# Patient Record
Sex: Female | Born: 1990 | Race: White | Hispanic: No | Marital: Single | Smoking: Never smoker
Health system: Southern US, Community
[De-identification: ages and names within clinical notes are randomized; demographics above are authoritative.]

## PROBLEM LIST (undated history)

## (undated) DIAGNOSIS — T7840XA Allergy, unspecified, initial encounter: Secondary | ICD-10-CM

## (undated) DIAGNOSIS — F419 Anxiety disorder, unspecified: Secondary | ICD-10-CM

## (undated) HISTORY — DX: Allergy, unspecified, initial encounter: T78.40XA

## (undated) HISTORY — PX: NO PAST SURGERIES: SHX2092

---
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
## 2012-05-17 ENCOUNTER — Other Ambulatory Visit: Payer: Self-pay | Admitting: Physician Assistant

## 2012-05-18 NOTE — Telephone Encounter (Signed)
No paper chart °

## 2012-09-20 ENCOUNTER — Emergency Department (HOSPITAL_COMMUNITY): Payer: 59

## 2012-09-20 ENCOUNTER — Encounter (HOSPITAL_COMMUNITY): Payer: Self-pay | Admitting: Physical Medicine and Rehabilitation

## 2012-09-20 ENCOUNTER — Emergency Department (HOSPITAL_COMMUNITY)
Admission: EM | Admit: 2012-09-20 | Discharge: 2012-09-20 | Disposition: A | Discharge status: Home / Self Care | Payer: 59 | Attending: Emergency Medicine | Admitting: Emergency Medicine

## 2012-09-20 DIAGNOSIS — IMO0002 Reserved for concepts with insufficient information to code with codable children: Secondary | ICD-10-CM | POA: Insufficient documentation

## 2012-09-20 DIAGNOSIS — Y92410 Unspecified street and highway as the place of occurrence of the external cause: Secondary | ICD-10-CM | POA: Insufficient documentation

## 2012-09-20 DIAGNOSIS — Z79899 Other long term (current) drug therapy: Secondary | ICD-10-CM | POA: Insufficient documentation

## 2012-09-20 DIAGNOSIS — S139XXA Sprain of joints and ligaments of unspecified parts of neck, initial encounter: Secondary | ICD-10-CM | POA: Insufficient documentation

## 2012-09-20 DIAGNOSIS — S161XXA Strain of muscle, fascia and tendon at neck level, initial encounter: Secondary | ICD-10-CM

## 2012-09-20 DIAGNOSIS — Y9389 Activity, other specified: Secondary | ICD-10-CM | POA: Insufficient documentation

## 2012-09-20 MED ORDER — CYCLOBENZAPRINE HCL 5 MG PO TABS: 5.0000 mg | ORAL_TABLET | Freq: Three times a day (TID) | ORAL | Status: DC | PRN

## 2012-09-20 MED ORDER — NAPROXEN 500 MG PO TABS: 500.0000 mg | ORAL_TABLET | Freq: Two times a day (BID) | ORAL | Status: DC

## 2012-09-20 MED ORDER — HYDROCODONE-ACETAMINOPHEN 5-325 MG PO TABS: 1.0000 | ORAL_TABLET | Freq: Four times a day (QID) | ORAL | Status: DC | PRN

## 2012-09-20 MED ORDER — HYDROCODONE-ACETAMINOPHEN 5-325 MG PO TABS
1.0000 | ORAL_TABLET | Freq: Once | ORAL | Status: AC
  Administered 2012-09-20: 1 via ORAL
  Filled 2012-09-20: qty 1
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
## 2013-06-12 ENCOUNTER — Other Ambulatory Visit: Payer: Self-pay | Admitting: Physician Assistant

## 2013-06-22 ENCOUNTER — Other Ambulatory Visit: Payer: Self-pay | Admitting: Physician Assistant

## 2013-06-23 ENCOUNTER — Other Ambulatory Visit: Payer: Self-pay | Admitting: Physician Assistant

## 2014-05-17 ENCOUNTER — Ambulatory Visit (INDEPENDENT_AMBULATORY_CARE_PROVIDER_SITE_OTHER): Payer: No Typology Code available for payment source | Admitting: Physician Assistant

## 2014-05-17 VITALS — BP 120/64 | HR 80 | Temp 99.2°F | Resp 16 | Ht 65.5 in | Wt 154.0 lb

## 2014-05-17 DIAGNOSIS — J0100 Acute maxillary sinusitis, unspecified: Secondary | ICD-10-CM

## 2014-05-17 MED ORDER — TRIAMCINOLONE ACETONIDE 55 MCG/ACT NA AERO: 2.0000 | INHALATION_SPRAY | Freq: Every day | NASAL | Status: DC

## 2014-05-17 MED ORDER — GUAIFENESIN ER 1200 MG PO TB12: 1.0000 | ORAL_TABLET | Freq: Two times a day (BID) | ORAL | Status: AC

## 2014-05-17 MED ORDER — AMOXICILLIN 875 MG PO TABS: 875.0000 mg | ORAL_TABLET | Freq: Two times a day (BID) | ORAL | Status: DC

## 2014-05-19 NOTE — Progress Notes (Signed)
Subjective:    Patient ID: Sheryl Booker, female    DOB: 05-15-1991, 23 y.o.   MRN: [id number]  HPI  Pt presents to clinic with continued cold symptoms and feeling poorly.  She is a [employer] and [facility].  She has ben really tired all semester.  Over the last month she has had congestion and this sore throat that seems to get a little better but never really resolves.  She has been taking OTC meds intermittently throughout the illness but never gets resolution.  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  She feels like she is being pushed harder than what she can handle but the coach will not let up on his pressure for her to swim longer and harder.  She really wants to get better.  Review of Systems  Constitutional: Negative for fever and chills.  HENT: Positive for congestion, postnasal drip, rhinorrhea (clear) and sore throat.   Respiratory: Negative for cough, shortness of breath and wheezing.   Neurological: Positive for headaches (intermittent at times).       Objective:   Physical Exam  Constitutional: She is oriented to person, place, and time. She appears well-developed and well-nourished.  BP 120/64 mmHg  Pulse 80  Temp(Src) 99.2 F (37.3 C)  Resp 16  Ht 5' 5.5" (1.664 m)  Wt 154 lb (69.854 kg)  BMI 25.23 kg/m2  SpO2 97%  LMP 04/17/2014   HENT:  Head: Normocephalic and atraumatic.  Right Ear: Hearing, tympanic membrane, external ear and ear canal normal.  Left Ear: Hearing, tympanic membrane, external ear and ear canal normal.  Nose: Mucosal edema (pale) present. Right sinus exhibits no maxillary sinus tenderness and no frontal sinus tenderness. Left sinus exhibits no maxillary sinus tenderness and no frontal sinus tenderness.  Mouth/Throat: Uvula is midline, oropharynx is clear and moist and mucous membranes are normal.  Neck:  Normal range of motion.  Cardiovascular: Normal rate, regular rhythm and normal heart sounds.   No murmur heard. Pulmonary/Chest: Effort normal and breath sounds normal. She has no wheezes.  Abdominal: Soft. There is no hepatosplenomegaly. There is no tenderness.  Lymphadenopathy:       Head (right side): No tonsillar and no occipital adenopathy present.       Head (left side): No tonsillar and no occipital adenopathy present.    She has no cervical adenopathy.       Right: No supraclavicular adenopathy present.       Left: No supraclavicular adenopathy present.  Neurological: She is alert and oriented to person, place, and time.  Skin: Skin is warm and dry.  Psychiatric: She has a normal mood and affect. Her behavior is normal. Judgment and thought content normal.   Pt had a mono titer at [employer] - It showed IgG positive and IgM negative for mono.        Assessment & Plan:  Subacute maxillary sinusitis - Plan: amoxicillin (AMOXIL) 875 MG tablet, Guaifenesin (MUCINEX MAXIMUM STRENGTH) 1200 MG TB12, triamcinolone (NASACORT AQ) 55 MCG/ACT AERO nasal inhaler   I am going to treat her for a sinus infection due to the timing of her symptoms. We are also going to treat her symptoms and give her a letter for swimming to allow her to rest during the semester break.  I will try and get her results from  [employer] after the holidays.  Benny LennertSarah Khaiden Segreto PA-C  [facility] 05/19/2014 8:54 AM

## 2014-06-22 ENCOUNTER — Other Ambulatory Visit: Payer: Self-pay | Admitting: Physician Assistant

## 2014-07-17 ENCOUNTER — Other Ambulatory Visit: Payer: Self-pay | Admitting: Physician Assistant

## 2015-07-16 ENCOUNTER — Encounter (HOSPITAL_COMMUNITY): Payer: Self-pay | Admitting: Family Medicine

## 2015-07-16 ENCOUNTER — Emergency Department (HOSPITAL_COMMUNITY)
Admission: EM | Admit: 2015-07-16 | Discharge: 2015-07-17 | Disposition: A | Discharge status: Other Acute Inpatient Hospital | Payer: BLUE CROSS/BLUE SHIELD | Attending: Emergency Medicine | Admitting: Emergency Medicine

## 2015-07-16 DIAGNOSIS — Z3202 Encounter for pregnancy test, result negative: Secondary | ICD-10-CM | POA: Insufficient documentation

## 2015-07-16 DIAGNOSIS — F4321 Adjustment disorder with depressed mood: Secondary | ICD-10-CM

## 2015-07-16 DIAGNOSIS — Z79899 Other long term (current) drug therapy: Secondary | ICD-10-CM | POA: Insufficient documentation

## 2015-07-16 DIAGNOSIS — R45851 Suicidal ideations: Secondary | ICD-10-CM

## 2015-07-16 LAB — COMPREHENSIVE METABOLIC PANEL
ALBUMIN: 5.1 g/dL — AB (ref 3.5–5.0)
ALT: 14 U/L (ref 14–54)
AST: 17 U/L (ref 15–41)
Alkaline Phosphatase: 47 U/L (ref 38–126)
Anion gap: 12 (ref 5–15)
BUN: 11 mg/dL (ref 6–20)
CO2: 23 mmol/L (ref 22–32)
Calcium: 9.9 mg/dL (ref 8.9–10.3)
Chloride: 105 mmol/L (ref 101–111)
Creatinine, Ser: 0.81 mg/dL (ref 0.44–1.00)
GFR calc Af Amer: >60 mL/min (ref >60)
GLUCOSE: 92 mg/dL (ref 65–99)
Potassium: 3.7 mmol/L (ref 3.5–5.1)
SODIUM: 140 mmol/L (ref 135–145)
TOTAL PROTEIN: 7.9 g/dL (ref 6.5–8.1)
Total Bilirubin: 0.6 mg/dL (ref 0.3–1.2)

## 2015-07-16 LAB — ACETAMINOPHEN LEVEL

## 2015-07-16 LAB — CBC
HCT: 42.5 % (ref 36.0–46.0)
Hemoglobin: 14.3 g/dL (ref 12.0–15.0)
MCH: 28.9 pg (ref 26.0–34.0)
MCHC: 33.6 g/dL (ref 30.0–36.0)
MCV: 86 fL (ref 78.0–100.0)
PLATELETS: 249 10*3/uL (ref 150–400)
RBC: 4.94 MIL/uL (ref 3.87–5.11)
RDW: 12.9 % (ref 11.5–15.5)
WBC: 5.4 10*3/uL (ref 4.0–10.5)

## 2015-07-16 LAB — RAPID URINE DRUG SCREEN, HOSP PERFORMED
AMPHETAMINES: NOT DETECTED
BENZODIAZEPINES: NOT DETECTED
Barbiturates: NOT DETECTED
COCAINE: NOT DETECTED
OPIATES: NOT DETECTED
Tetrahydrocannabinol: NOT DETECTED

## 2015-07-16 LAB — ETHANOL

## 2015-07-16 LAB — SALICYLATE LEVEL

## 2015-07-16 LAB — POC URINE PREG, ED: PREG TEST UR: NEGATIVE

## 2015-07-16 MED ORDER — NORGESTIMATE-ETH ESTRADIOL 0.25-35 MG-MCG PO TABS: 1.0000 | ORAL_TABLET | Freq: Every day | ORAL | Status: DC

## 2015-07-16 MED ORDER — ZOLPIDEM TARTRATE 5 MG PO TABS: 5.0000 mg | ORAL_TABLET | Freq: Every evening | ORAL | Status: DC | PRN

## 2015-07-16 MED ORDER — ONDANSETRON HCL 4 MG PO TABS: 4.0000 mg | ORAL_TABLET | Freq: Three times a day (TID) | ORAL | Status: DC | PRN

## 2015-07-16 MED ORDER — IBUPROFEN 200 MG PO TABS: 600.0000 mg | ORAL_TABLET | Freq: Three times a day (TID) | ORAL | Status: DC | PRN

## 2015-07-16 MED ORDER — LORAZEPAM 1 MG PO TABS: 1.0000 mg | ORAL_TABLET | Freq: Three times a day (TID) | ORAL | Status: DC | PRN

## 2015-07-16 MED ORDER — ACETAMINOPHEN 325 MG PO TABS: 650.0000 mg | ORAL_TABLET | ORAL | Status: DC | PRN

## 2015-07-16 MED ORDER — ALUM & MAG HYDROXIDE-SIMETH 200-200-20 MG/5ML PO SUSP: 30.0000 mL | ORAL | Status: DC | PRN

## 2015-07-16 NOTE — BH Assessment (Signed)
Assessment completed. Consulted Hulan Fess, NP who recommended overnight observation. Informed Mercedes Camprubi-Soms, PA-C of the recommendation.
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
## 2015-07-16 NOTE — ED Notes (Signed)
Pt being eval by TTS [facility] at present. 
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
## 2015-07-16 NOTE — ED Provider Notes (Signed)
CSN: [id number]     Arrival date & time 07/16/15  1931 History   First MD Initiated Contact with Patient 07/16/15 2104     Chief Complaint  Patient presents with  . Suicidal     (Consider location/radiation/quality/duration/timing/severity/associated sxs/prior Treatment) HPI Comments: Sheryl Booker is a 25 y.o. female who presents to the ED accompanied by law enforcement who to brought her in after her ex-boyfriend called the police due to pt being suicidal. Pt states that she's had suicidal ideations x3 wks due to breaking up with her boyfriend, has no prior hx of depression. States her plan was to "crash her car". She came in voluntarily when the cops gave her the option of coming voluntarily or involuntarily, does not seem that IVC paperwork was taken out. She denies HI/AVH. Denies illicit drug use and EtOH use. Nonsmoker. No medical problems, only medicines are birth control. Currently on her menses. She has no medical complaints today. Her mother accompanied her today. Pt states she "wants help" for her suicidal thoughts. Does not want to go into what happened at her ex-boyfriends house, simply states that she went over there to see their dog and see him, and that then the cops were called.  Patient is a 25 y.o. female presenting with mental health disorder. The history is provided by the patient. No language interpreter was used.  Mental Health Problem Presenting symptoms: suicidal thoughts and suicidal threats   Presenting symptoms: no hallucinations and no homicidal ideas   Patient accompanied by:  Family member and law enforcement Onset quality:  Gradual Duration:  3 weeks Timing:  Constant Progression:  Unchanged Chronicity:  New Context: stressful life event   Treatment compliance:  Untreated Relieved by:  None tried Worsened by:  Nothing tried Ineffective treatments:  None tried Associated symptoms: no abdominal pain and no chest pain   Risk factors: no hx of mental illness      History reviewed. No pertinent past medical history. History reviewed. No pertinent past surgical history. History reviewed. No pertinent family history. Social History  Substance Use Topics  . Smoking status: Never Smoker   . Smokeless tobacco: None  . Alcohol Use: No   OB History    No data available     Review of Systems  Constitutional: Negative for fever and chills.  Respiratory: Negative for shortness of breath.   Cardiovascular: Negative for chest pain.  Gastrointestinal: Negative for nausea, vomiting, abdominal pain, diarrhea and constipation.  Genitourinary: Negative for dysuria, hematuria and vaginal discharge. Vaginal bleeding: on menses.  Musculoskeletal: Negative for myalgias and arthralgias.  Skin: Negative for color change.  Allergic/Immunologic: Negative for immunocompromised state.  Neurological: Negative for weakness and numbness.  Psychiatric/Behavioral: Positive for suicidal ideas. Negative for homicidal ideas, hallucinations and confusion.   10 Systems reviewed and are negative for acute change except as noted in the HPI.    Allergies  Sulfa antibiotics  Home Medications   Prior to Admission medications   Medication Sig Start Date End Date Taking? Authorizing Provider  ibuprofen (ADVIL,MOTRIN) 200 MG tablet Take 400 mg by mouth every 6 (six) hours as needed for headache or moderate pain.   Yes Historical Provider, MD  Multiple Vitamins-Minerals (HAIR/SKIN/NAILS) TABS Take 1 tablet by mouth 2 (two) times daily.   Yes Historical Provider, MD  ORTHO-CYCLEN, 28, 0.25-35 MG-MCG tablet Take 1 tablet by mouth daily. 06/21/15  Yes Historical Provider, MD   BP 139/91 mmHg  Pulse 97  Temp(Src) 98.9 F (37.2 C) (Oral)  Resp 14  Ht 5\' 6"  (1.676 m)  Wt 77.111 kg  BMI 27.45 kg/m2  SpO2 100%  LMP 07/16/2015 Physical Exam  Constitutional: She is oriented to person, place, and time. Vital signs are normal. She appears well-developed and well-nourished.   Non-toxic appearance. No distress.  Afebrile, nontoxic, NAD  HENT:  Head: Normocephalic and atraumatic.  Mouth/Throat: Oropharynx is clear and moist and mucous membranes are normal.  Eyes: Conjunctivae and EOM are normal. Right eye exhibits no discharge. Left eye exhibits no discharge.  Neck: Normal range of motion. Neck supple.  Cardiovascular: Normal rate, regular rhythm, normal heart sounds and intact distal pulses.  Exam reveals no gallop and no friction rub.   No murmur heard. Pulmonary/Chest: Effort normal and breath sounds normal. No respiratory distress. She has no decreased breath sounds. She has no wheezes. She has no rhonchi. She has no rales.  Abdominal: Soft. Normal appearance and bowel sounds are normal. She exhibits no distension. There is no tenderness. There is no rigidity, no rebound, no guarding, no CVA tenderness, no tenderness at McBurney's point and negative Murphy's sign.  Musculoskeletal: Normal range of motion.  Neurological: She is alert and oriented to person, place, and time. She has normal strength. No sensory deficit.  Skin: Skin is warm, dry and intact. No rash noted.  Psychiatric: She is not actively hallucinating. She exhibits a depressed mood. She expresses suicidal ideation. She expresses no homicidal ideation. She expresses suicidal plans. She expresses no homicidal plans.  Depressed affect, not wanting to talk much, endorsing SI with a plan to crash her car. No HI/AVH endorsed.  Nursing note and vitals reviewed.   ED Course  Procedures (including critical care time) Labs Review Labs Reviewed  COMPREHENSIVE METABOLIC PANEL - Abnormal; Notable for the following:    Albumin 5.1 (*)    All other components within normal limits  ACETAMINOPHEN LEVEL - Abnormal; Notable for the following:    Acetaminophen (Tylenol), Serum <10 (*)    All other components within normal limits  ETHANOL  SALICYLATE LEVEL  CBC  URINE RAPID DRUG SCREEN, HOSP PERFORMED  POC  URINE PREG, ED    Imaging Review No results found. I have personally reviewed and evaluated these images and lab results as part of my medical decision-making.   EKG Interpretation None      MDM   Final diagnoses:  Suicidal ideations  Suicide threat or attempt  Adjustment disorder with depressed mood    25 y.o. female here with SI x3 wks, plan to crash her car. Boyfriend call the cops on her today and she came voluntarily. Does not seem that IVC paperwork was taken out, but if she attempted to leave we would need to get this paperwork started. No HI/AVH. No EtOH or drug use. Nonsmoker. Otherwise healthy. Will await clearance labs and consult TTS. Holding orders and home meds ordered. Will reassess after labs return.   9:54 PM Upreg neg. UDS pending but this should not hold up her med clearance. All other labs unremarkable. Pt medically cleared. TTS consulted, please see their notes for further documentation of care and dispo status. Pt here voluntarily, if she tries to leave will need to get IVC paperwork taken out. Pt stable at this time. Holding orders placed. Home OCPs ordered but pharmacy states they don't carry it, pt will need to use her own supply.  BP 139/91 mmHg  Pulse 97  Temp(Src) 98.9 F (37.2 C) (Oral)  Resp 14  Ht 5\' 6"  (  1.676 m)  Wt 77.111 kg  BMI 27.45 kg/m2  SpO2 100%  LMP 07/16/2015  Meds ordered this encounter  Medications  . ondansetron (ZOFRAN) tablet 4 mg    Sig:   . alum & mag hydroxide-simeth (MAALOX/MYLANTA) 200-200-20 MG/5ML suspension 30 mL    Sig:   . zolpidem (AMBIEN) tablet 5 mg    Sig:   . ibuprofen (ADVIL,MOTRIN) tablet 600 mg    Sig:   . acetaminophen (TYLENOL) tablet 650 mg    Sig:   . LORazepam (ATIVAN) tablet 1 mg    Sig:   . DISCONTD: norgestimate-ethinyl estradiol (ORTHO-CYCLEN,SPRINTEC,PREVIFEM) 0.25-35 MG-MCG tablet 1 tablet    Sig:        Saraia Platner Camprubi-Soms, PA-C 07/16/15 2156  Raeford Razor, MD 07/20/15 [phone]

## 2015-07-17 ENCOUNTER — Observation Stay (HOSPITAL_COMMUNITY)
Admission: AD | Admit: 2015-07-17 | Discharge: 2015-07-17 | Disposition: A | Discharge status: Home / Self Care | Payer: BLUE CROSS/BLUE SHIELD | Source: Intra-hospital | Attending: Psychiatry | Admitting: Psychiatry

## 2015-07-17 DIAGNOSIS — R45851 Suicidal ideations: Secondary | ICD-10-CM | POA: Diagnosis not present

## 2015-07-17 DIAGNOSIS — F4321 Adjustment disorder with depressed mood: Secondary | ICD-10-CM | POA: Insufficient documentation

## 2015-07-17 NOTE — ED Notes (Signed)
Report called to RN Lowella Bandy, OBS Unit.  Pending [facility] transport.

## 2015-07-17 NOTE — ED Notes (Signed)
Called [facility] transport.

## 2015-07-17 NOTE — Progress Notes (Signed)
Sheryl Booker came in from [facility] accompanied by her mom. Sheryl Booker expressing not being happy moving her one place to another. This Clinical research associate explained to Sheryl Booker and mother the plan of care for her being admitted in Obs. Unit. Sheryl Booker stated "why is no one telling me the truth about my stay here in the hospital. I want to go home". 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  Writer notified Christen Bame, NP of Sheryl Booker's decision. Christen Bame, NP spoke to Sheryl Booker and mom. Mom agreed to stay with Sheryl Booker tonight for safety reasons. Sheryl Booker was discharged home.

## 2015-07-18 ENCOUNTER — Ambulatory Visit (INDEPENDENT_AMBULATORY_CARE_PROVIDER_SITE_OTHER): Payer: BLUE CROSS/BLUE SHIELD | Admitting: Physician Assistant

## 2015-07-18 ENCOUNTER — Telehealth: Payer: Self-pay

## 2015-07-18 ENCOUNTER — Telehealth: Payer: Self-pay | Admitting: Physician Assistant

## 2015-07-18 VITALS — BP 130/80 | HR 79 | Temp 98.6°F | Resp 16 | Ht 66.0 in | Wt 165.8 lb

## 2015-07-18 DIAGNOSIS — N946 Dysmenorrhea, unspecified: Secondary | ICD-10-CM | POA: Diagnosis not present

## 2015-07-18 MED ORDER — NORGESTIMATE-ETH ESTRADIOL 0.25-35 MG-MCG PO TABS: 1.0000 | ORAL_TABLET | Freq: Every day | ORAL | Status: DC

## 2015-07-18 MED ORDER — LEVONORGESTREL-ETHINYL ESTRAD 0.1-20 MG-MCG PO TABS: 1.0000 | ORAL_TABLET | Freq: Every day | ORAL | Status: DC

## 2015-07-18 NOTE — Progress Notes (Signed)
   Sheryl Booker  MRN: [id number] DOB: 30-Jan-1991  Subjective:  Pt presents to clinic because she needs her birth control refilled.  When she called the pharmacy she was told her insurance would not longer cover the type of birth control she was on and she would like to switch.  She is doing well on it and her cramps are much better - she is currently not sexually active.  She has not had a pap smear but she does not have rime today and she really wants to set up her care at her moms gyn.  There are no active problems to display for this patient.   Current Outpatient Prescriptions on File Prior to Visit  Medication Sig Dispense Refill  . ibuprofen (ADVIL,MOTRIN) 200 MG tablet Take 400 mg by mouth every 6 (six) hours as needed for headache or moderate pain.    . Multiple Vitamins-Minerals (HAIR/SKIN/NAILS) TABS Take 1 tablet by mouth 2 (two) times daily.     No current facility-administered medications on file prior to visit.    Allergies  Allergen Reactions  . Sulfa Antibiotics Anaphylaxis    Review of Systems Objective:  BP 130/80 mmHg  Pulse 79  Temp(Src) 98.6 F (37 C) (Oral)  Resp 16  Ht  (1.676 m)  Wt 165 lb 12.8 oz (75.206 kg)  BMI 26.77 kg/m2  SpO2 99%  LMP 07/16/2015  Physical Exam  Constitutional: She is oriented to person, place, and time and well-developed, well-nourished, and in no distress.  HENT:  Head: Normocephalic and atraumatic.  Right Ear: Hearing and external ear normal.  Left Ear: Hearing and external ear normal.  Eyes: Conjunctivae are normal.  Neck: Normal range of motion.  Pulmonary/Chest: Effort normal.  Neurological: She is alert and oriented to person, place, and time. Gait normal.  Skin: Skin is warm and dry.  Psychiatric: Mood, memory, affect and judgment normal.  Vitals reviewed.   Assessment and Plan :  Dysmenorrhea - Plan: norgestimate-ethinyl estradiol (ORTHO-CYCLEN, 28,) 0.25-35 MG-MCG tablet, DISCONTINUED:  levonorgestrel-ethinyl estradiol (AVIANE) 0.1-20 MG-MCG tablet  Refilled medication - she will f/u with her new PCP for pap smear  Benny Lennert PA-C  [facility] 07/18/2015 3:12 PM

## 2015-07-18 NOTE — Telephone Encounter (Signed)
Called pharmacy and canceled her aviane rx by request of Maralyn Sago

## 2015-07-18 NOTE — Telephone Encounter (Signed)
Open in error

## 2016-06-20 ENCOUNTER — Other Ambulatory Visit: Payer: Self-pay | Admitting: Physician Assistant

## 2016-06-20 DIAGNOSIS — N946 Dysmenorrhea, unspecified: Secondary | ICD-10-CM

## 2016-06-20 NOTE — Telephone Encounter (Signed)
Spoke with patient, sent in a 3 month refill for patient. She is due for f/u. She plans on calling the clinic for a f/u appointment soon.

## 2016-06-20 NOTE — Telephone Encounter (Signed)
Patient needs her Birth control refilled she states she was suppose to to start them yesterday but doesn't have them.  She would like to know how she is suppose to take them now since she missed her first dose.  Her call back number is [phone]

## 2016-06-21 NOTE — Telephone Encounter (Signed)
Please call patient and get her to schedule an appt with me within 2 months.

## 2016-09-13 ENCOUNTER — Ambulatory Visit (INDEPENDENT_AMBULATORY_CARE_PROVIDER_SITE_OTHER): Payer: BLUE CROSS/BLUE SHIELD | Admitting: Family Medicine

## 2016-09-13 VITALS — BP 124/83 | HR 80 | Temp 98.1°F | Resp 18 | Ht 66.0 in | Wt 169.2 lb

## 2016-09-13 DIAGNOSIS — Z124 Encounter for screening for malignant neoplasm of cervix: Secondary | ICD-10-CM | POA: Diagnosis not present

## 2016-09-13 DIAGNOSIS — Z0000 Encounter for general adult medical examination without abnormal findings: Secondary | ICD-10-CM | POA: Diagnosis not present

## 2016-09-13 DIAGNOSIS — N946 Dysmenorrhea, unspecified: Secondary | ICD-10-CM | POA: Diagnosis not present

## 2016-09-13 MED ORDER — NORGESTIMATE-ETH ESTRADIOL 0.25-35 MG-MCG PO TABS: 1.0000 | ORAL_TABLET | Freq: Every day | ORAL | 3 refills | Status: DC

## 2016-09-13 NOTE — Patient Instructions (Addendum)
   IF you received an x-ray today, you will receive an invoice from [facility]. Please contact [facility] at [phone] with questions or concerns regarding your invoice.   IF you received labwork today, you will receive an invoice from [facility]. Please contact [facility] at [phone] with questions or concerns regarding your invoice.   Our billing staff will not be able to assist you with questions regarding bills from these companies.  You will be contacted with the lab results as soon as they are available. The fastest way to get your results is to activate your My Chart account. Instructions are located on the last page of this paperwork. If you have not heard from us regarding the results in 2 weeks, please contact this office.     Preventing Cervical Cancer Cervical cancer is cancer that grows on the cervix. The cervix is at the bottom of the uterus. It connects the uterus to the vagina. The uterus is where a baby develops during pregnancy. Cancer occurs when cells become abnormal and start to grow out of control. Cervical cancer grows slowly and may not cause any symptoms at first. Over time, the cancer can grow deep into the cervix tissue and spread to other areas. If it is found early, cervical cancer can be treated effectively. You can also take steps to prevent this type of cancer. Most cases of cervical cancer are caused by an STI (sexually transmitted infection) called human papillomavirus (HPV). One way to reduce your risk of cervical cancer is to avoid infection with the HPV virus. You can do this by practicing safe sex and by getting the HPV vaccine. Getting regular Pap tests is also important because this can help identify changes in cells that could lead to cancer. Your chances of getting this disease can also be reduced by making certain lifestyle changes. How can I protect myself from cervical cancer? Preventing HPV infection  Ask your health care  provider about getting the HPV vaccine. If you are 26 years old or younger, you may need to get this vaccine, which is given in three doses over 6 months. This vaccine protects against the types of HPV that could cause cancer.  Limit the number of people you have sex with. Also avoid having sex with people who have had many sex partners.  Use a latex condom during sex. Getting Pap tests  Get Pap tests regularly, starting at age 21. Talk with your health care provider about how often you need these tests.  Most women who are 21?24 years of age should have a Pap test every 3 years.  Most women who are 30?24 years of age should have a Pap test in combination with an HPV test every 5 years.  Women with a higher risk of cervical cancer, such as those with a weakened immune system or those who have been exposed to the drug diethylstilbestrol (DES), may need more frequent testing. Making other lifestyle changes  Do not use any products that contain nicotine or tobacco, such as cigarettes and e-cigarettes. If you need help quitting, ask your health care provider.  Eat at least 5 servings of fruits and vegetables every day.  Lose weight if you are overweight. Why are these changes important?  These changes and screening tests are designed to address the factors that are known to increase the risk of cervical cancer. Taking these steps is the best way to reduce your risk.  Having regular Pap tests will help identify changes   in cells that could lead to cancer. Steps can then be taken to prevent cancer from developing.  These changes will also help find cervical cancer early. This type of cancer can be treated effectively if it is found early. It can be more dangerous and difficult to treat if cancer has grown deep into your cervix or has spread.  In addition to making you less likely to get cervical cancer, these changes will also provide other health benefits, such as the following:  Practicing  safe sex is important for preventing STIs and unplanned pregnancies.  Avoiding tobacco can reduce your risk for other cancers and health issues.  Eating a healthy diet and maintaining a healthy weight are good for your overall health. What can happen if changes are not made? In the early stages, cervical cancer might not have any symptoms. It can take many years for the cancer to grow and get deep into the cervix tissue. This may be happening without you knowing about it. If you develop any symptoms, such as pelvic pain or unusual discharge or bleeding from your vagina, you should see your health care provider right away. If cervical cancer is not found early, you might need treatments such as radiation, chemotherapy, or surgery. In some cases, surgery may mean that you will not be able to get pregnant or carry a pregnancy to term. Where to find support: Talk with your health care provider, school nurse, or local health department for guidance about screening and vaccination. Some children and teens may be able to get the HPV vaccine free of charge through the U.S. government's Vaccines for Children (VFC) program. Other places that provide vaccinations include:  Public health clinics. Check with your local health department.  Federally Qualified Health Centers, where you would pay only what you can afford. To find one near you, check this website: www.fqhc.org/find-an-fqhc/  Rural Health Clinics. These are part of a program for Medicare and Medicaid patients who live in rural areas. The National Breast and Cervical Cancer Early Detection Program also provides breast and cervical cancer screenings and diagnostic services to low-income, uninsured, and underinsured women. Cervical cancer can be passed down through families. Talk with your health care provider or genetic counselor to learn more about genetic testing for cancer. Where to find more information: Learn more about cervical cancer  from:  American College of Gynecology: www.acog.org/Patients/FAQs/Cervical-Cancer  American Cancer Society: www.cancer.org/cancer/cervicalcancer/  U.S. Centers for Disease Control and Prevention: www.cdc.gov/cancer/cervical/ Summary  Talk with your health care provider about getting the HPV vaccine.  Be sure to get regular Pap tests as recommended by your health care provider.  See your health care provider right away if you have any pelvic pain or unusual discharge or bleeding from your vagina. This information is not intended to replace advice given to you by your health care provider. Make sure you discuss any questions you have with your health care provider. Document Released: 05/28/2015 Document Revised: 01/09/2016 Document Reviewed: 01/09/2016 Elsevier Interactive Patient Education  2017 [employer]  

## 2016-09-13 NOTE — Progress Notes (Signed)
Chief Complaint  Patient presents with  . Annual Exam    needs it for birth control     Subjective:  Sheryl Booker is a 26 y.o. female here for a health maintenance visit.  Patient is established pt  Patient's last menstrual period was 09/06/2016. She is on OCPs She denies side effects  Non smoker No migraines No family history of clotting disorder  There are no active problems to display for this patient.   Past Medical History:  Diagnosis Date  . Allergy     No past surgical history on file.   Outpatient Medications Prior to Visit  Medication Sig Dispense Refill  . MONONESSA 0.25-35 MG-MCG tablet TAKE 1 TABLET BY MOUTH EVERY DAY 3 Package 0  . MONONESSA 0.25-35 MG-MCG tablet TAKE 1 TABLET BY MOUTH EVERY DAY 84 tablet 0  . ibuprofen (ADVIL,MOTRIN) 200 MG tablet Take 400 mg by mouth every 6 (six) hours as needed for headache or moderate pain.    . Multiple Vitamins-Minerals (HAIR/SKIN/NAILS) TABS Take 1 tablet by mouth 2 (two) times daily.     No facility-administered medications prior to visit.     Allergies  Allergen Reactions  . Sulfa Antibiotics Anaphylaxis     Family History  Problem Relation Age of Onset  . Diabetes Maternal Grandmother   . Stroke Maternal Grandmother   . Cancer Maternal Grandfather      Health Habits: Dental Exam: up to date Eye Exam: up to date Exercise: 3 times/week on average Current exercise activities: walking/running Diet:   Social History   Social History  . Marital status: Single    Spouse name: N/A  . Number of children: N/A  . Years of education: N/A   Occupational History  . Not on file.   Social History Main Topics  . Smoking status: Never Smoker  . Smokeless tobacco: Never Used  . Alcohol use No  . Drug use: No  . Sexual activity: Not on file   Other Topics Concern  . Not on file   Social History Narrative   [place] college graduate.   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    History  Alcohol Use No   History  Smoking Status  . Never Smoker  Smokeless Tobacco  . Never Used   History  Drug Use No    GYN: Sexual Health Menstrual status: regular menses LMP: Patient's last menstrual period was 09/06/2016. Last pap smear: see HM section   Health Maintenance: See under health Maintenance activity for review of completion dates as well.  There is no immunization history on file for this patient.    Depression Screen-PHQ2/9 Depression screen [facility] 2/9 09/13/2016 07/18/2015 07/18/2015  Decreased Interest 0 0 0  Down, Depressed, Hopeless 0 0 0  PHQ - 2 Score 0 0 0       Depression Severity and Treatment Recommendations:  0-4= None  5-9= Mild / Treatment: Support, educate to call if worse; return in one month  10-14= Moderate / Treatment: Support, watchful waiting; Antidepressant or Psycotherapy  15-19= Moderately severe / Treatment: Antidepressant OR Psychotherapy  >= 20 = Major depression, severe / Antidepressant AND Psychotherapy    Review of Systems   Review of Systems  Constitutional: Negative for chills and fever.  HENT: Negative for ear pain, hearing loss and tinnitus.   Eyes: Negative for blurred vision and double vision.  Respiratory: Negative for cough, shortness of breath and wheezing.   Cardiovascular: Negative for chest pain,  palpitations, orthopnea and claudication.  Gastrointestinal: Negative for abdominal pain, nausea and vomiting.  Musculoskeletal: Negative for joint pain, myalgias and neck pain.  Skin: Negative for itching and rash.  Neurological: Negative for dizziness, tingling and headaches.  Psychiatric/Behavioral: Negative for depression and suicidal ideas. The patient is nervous/anxious.     See HPI for ROS as well.    Objective:   Vitals:   09/13/16 1028  BP: 124/83  Pulse: 80  Resp: 18  Temp: 98.1 F (36.7 C)  TempSrc: Oral  SpO2: 97%  Weight: 169 lb 3.2 oz (76.7 kg)  Height:  (1.676 m)     Body mass index is 27.31 kg/m.  Physical Exam  Constitutional: She is oriented to person, place, and time. She appears well-developed and well-nourished.  HENT:  Head: Normocephalic and atraumatic.  Right Ear: External ear normal.  Left Ear: External ear normal.  Nose: Nose normal.  Mouth/Throat: Oropharynx is clear and moist. No oropharyngeal exudate.  Eyes: Conjunctivae and EOM are normal. Pupils are equal, round, and reactive to light. Right eye exhibits no discharge. Left eye exhibits no discharge.  Neck: Normal range of motion. Neck supple. No thyromegaly present.  Cardiovascular: Normal rate, regular rhythm, normal heart sounds and intact distal pulses.   No murmur heard. Pulmonary/Chest: Effort normal and breath sounds normal. No respiratory distress. She has no wheezes. She has no rales.  Abdominal: Soft. Bowel sounds are normal. She exhibits no distension and no mass. There is no tenderness. There is no rebound and no guarding.  Neurological: She is alert and oriented to person, place, and time. She has normal reflexes.  Skin: Skin is warm. No erythema.  Psychiatric: She has a normal mood and affect. Her behavior is normal. Judgment and thought content normal.   Vaginal exam- with chaperon present Labia normal bilaterally without skin lesions Urethral meatus normal appearing without erythema Vagina without discharge Pap smear performed Due to pt anxiety and visible tension deferred bimanual exam      Assessment/Plan:   Patient was seen for a health maintenance exam.  Counseled the patient on health maintenance issues. Reviewed her health mainteance schedule and ordered appropriate tests (see orders.) Counseled on regular exercise and weight management. Recommend regular eye exams and dental cleaning.   The following issues were addressed today for health maintenance:   Sheryl Booker was seen today for annual exam.  Diagnoses and all orders for this visit:  Encounter  for health maintenance examination in adult  Encounter for Papanicolaou smear for cervical cancer screening -     Pap IG, CT/NG w/ reflex HPV when ASC-U  Dysmenorrhea -     norgestimate-ethinyl estradiol (MONONESSA) 0.25-35 MG-MCG tablet; Take 1 tablet by mouth daily.    No Follow-up on file.    Body mass index is 27.31 kg/m.:  Discussed the patient's BMI with patient. The BMI body mass index is 27.31 kg/m.     No future appointments.  Patient Instructions       IF you received an x-ray today, you will receive an invoice from [facility]. Please contact [facility] at [phone] with questions or concerns regarding your invoice.   IF you received labwork today, you will receive an invoice from [facility]. Please contact [facility] at [phone] with questions or concerns regarding your invoice.   Our billing staff will not be able to assist you with questions regarding bills from these companies.  You will be contacted with the lab results as soon as they are  available. The fastest way to get your results is to activate your My Chart account. Instructions are located on the last page of this paperwork. If you have not heard from [place] regarding the results in 2 weeks, please contact this office.      Preventing Cervical Cancer Cervical cancer is cancer that grows on the cervix. The cervix is at the bottom of the uterus. It connects the uterus to the vagina. The uterus is where a baby develops during pregnancy. Cancer occurs when cells become abnormal and start to grow out of control. Cervical cancer grows slowly and may not cause any symptoms at first. Over time, the cancer can grow deep into the cervix tissue and spread to other areas. If it is found early, cervical cancer can be treated effectively. You can also take steps to prevent this type of cancer. Most cases of cervical cancer are caused by an STI (sexually transmitted infection) called human  papillomavirus (HPV). One way to reduce your risk of cervical cancer is to avoid infection with the HPV virus. You can do this by practicing safe sex and by getting the HPV vaccine. Getting regular Pap tests is also important because this can help identify changes in cells that could lead to cancer. Your chances of getting this disease can also be reduced by making certain lifestyle changes. How can I protect myself from cervical cancer? Preventing HPV infection   Ask your health care provider about getting the HPV vaccine. If you are 19 years old or younger, you may need to get this vaccine, which is given in three doses over 6 months. This vaccine protects against the types of HPV that could cause cancer.  Limit the number of people you have sex with. Also avoid having sex with people who have had many sex partners.  Use a latex condom during sex. Getting Pap tests   Get Pap tests regularly, starting at age 54. Talk with your health care provider about how often you need these tests.  Most women who are 3?24 years of age should have a Pap test every 3 years.  Most women who are 85?24 years of age should have a Pap test in combination with an HPV test every 5 years.  Women with a higher risk of cervical cancer, such as those with a weakened immune system or those who have been exposed to the drug diethylstilbestrol (DES), may need more frequent testing. Making other lifestyle changes   Do not use any products that contain nicotine or tobacco, such as cigarettes and e-cigarettes. If you need help quitting, ask your health care provider.  Eat at least 5 servings of fruits and vegetables every day.  Lose weight if you are overweight. Why are these changes important?  These changes and screening tests are designed to address the factors that are known to increase the risk of cervical cancer. Taking these steps is the best way to reduce your risk.  Having regular Pap tests will help  identify changes in cells that could lead to cancer. Steps can then be taken to prevent cancer from developing.  These changes will also help find cervical cancer early. This type of cancer can be treated effectively if it is found early. It can be more dangerous and difficult to treat if cancer has grown deep into your cervix or has spread.  In addition to making you less likely to get cervical cancer, these changes will also provide other health benefits, such as the following:  Practicing safe sex is important for preventing STIs and unplanned pregnancies.  Avoiding tobacco can reduce your risk for other cancers and health issues.  Eating a healthy diet and maintaining a healthy weight are good for your overall health. What can happen if changes are not made? In the early stages, cervical cancer might not have any symptoms. It can take many years for the cancer to grow and get deep into the cervix tissue. This may be happening without you knowing about it. If you develop any symptoms, such as pelvic pain or unusual discharge or bleeding from your vagina, you should see your health care provider right away. If cervical cancer is not found early, you might need treatments such as radiation, chemotherapy, or surgery. In some cases, surgery may mean that you will not be able to get pregnant or carry a pregnancy to term. Where to find support: Talk with your health care provider, school nurse, or local health department for guidance about screening and vaccination. Some children and teens may be able to get the HPV vaccine free of charge through the U.S. government's Vaccines for Children [facility]) program. Other places that provide vaccinations include:  Public health clinics. Check with your local health department.  Federally [employer], where you would pay only what you can afford. To find one near you, check this website: http://weiss.info/  [facility] Clinics. These are  part of a program for Medicare and Medicaid patients who live in rural areas. The National Breast and Cervical Cancer Early Detection Program also provides breast and cervical cancer screenings and diagnostic services to low-income, uninsured, and underinsured women. Cervical cancer can be passed down through families. Talk with your health care provider or genetic counselor to learn more about genetic testing for cancer. Where to find more information: Learn more about cervical cancer from:  [employer] of Gyne[employer]: HardChecks.be  American Cancer Society: www.cancer.org/cancer/cervicalcancer/  U.S. Centers for Disease Control and Prevention: MedicationWarning.tn Summary  Talk with your health care provider about getting the HPV vaccine.  Be sure to get regular Pap tests as recommended by your health care provider.  See your health care provider right away if you have any pelvic pain or unusual discharge or bleeding from your vagina. This information is not intended to replace advice given to you by your health care provider. Make sure you discuss any questions you have with your health care provider. Document Released: 05/28/2015 Document Revised: 01/09/2016 Document Reviewed: 01/09/2016 Elsevier Interactive Patient Education  2017 [employer].

## 2016-09-16 LAB — PAP IG, CT-NG, RFX HPV ASCU
Chlamydia, Nuc. Acid Amp: NEGATIVE
Gonococcus by Nucleic Acid Amp: NEGATIVE
PAP Smear Comment: 0

## 2016-09-17 ENCOUNTER — Encounter: Payer: Self-pay | Admitting: Family Medicine

## 2016-12-02 ENCOUNTER — Other Ambulatory Visit: Payer: Self-pay | Admitting: Urgent Care

## 2016-12-02 DIAGNOSIS — N946 Dysmenorrhea, unspecified: Secondary | ICD-10-CM

## 2017-01-21 ENCOUNTER — Ambulatory Visit: Payer: BLUE CROSS/BLUE SHIELD | Admitting: Physician Assistant

## 2017-08-10 ENCOUNTER — Other Ambulatory Visit: Payer: Self-pay | Admitting: Family Medicine

## 2017-08-10 DIAGNOSIS — N946 Dysmenorrhea, unspecified: Secondary | ICD-10-CM

## 2018-09-28 ENCOUNTER — Other Ambulatory Visit: Payer: Self-pay

## 2018-09-28 ENCOUNTER — Emergency Department (HOSPITAL_COMMUNITY): Payer: BLUE CROSS/BLUE SHIELD

## 2018-09-28 ENCOUNTER — Emergency Department (HOSPITAL_COMMUNITY)
Admission: EM | Admit: 2018-09-28 | Discharge: 2018-09-29 | Disposition: A | Discharge status: Home / Self Care | Payer: BLUE CROSS/BLUE SHIELD | Attending: Emergency Medicine | Admitting: Emergency Medicine

## 2018-09-28 ENCOUNTER — Encounter (HOSPITAL_COMMUNITY): Payer: Self-pay | Admitting: Emergency Medicine

## 2018-09-28 DIAGNOSIS — Y9351 Activity, roller skating (inline) and skateboarding: Secondary | ICD-10-CM | POA: Diagnosis not present

## 2018-09-28 DIAGNOSIS — Z79899 Other long term (current) drug therapy: Secondary | ICD-10-CM | POA: Insufficient documentation

## 2018-09-28 DIAGNOSIS — Y929 Unspecified place or not applicable: Secondary | ICD-10-CM | POA: Insufficient documentation

## 2018-09-28 DIAGNOSIS — S52502A Unspecified fracture of the lower end of left radius, initial encounter for closed fracture: Secondary | ICD-10-CM | POA: Insufficient documentation

## 2018-09-28 DIAGNOSIS — Y999 Unspecified external cause status: Secondary | ICD-10-CM | POA: Diagnosis not present

## 2018-09-28 DIAGNOSIS — S6992XA Unspecified injury of left wrist, hand and finger(s), initial encounter: Secondary | ICD-10-CM | POA: Diagnosis present

## 2018-09-28 DIAGNOSIS — S52612A Displaced fracture of left ulna styloid process, initial encounter for closed fracture: Secondary | ICD-10-CM | POA: Diagnosis not present

## 2018-09-28 DIAGNOSIS — F419 Anxiety disorder, unspecified: Secondary | ICD-10-CM | POA: Insufficient documentation

## 2018-09-28 MED ORDER — HYDROMORPHONE HCL 1 MG/ML IJ SOLN
1.0000 mg | Freq: Once | INTRAMUSCULAR | Status: AC
  Administered 2018-09-28: 23:00:00 1 mg via INTRAMUSCULAR
  Filled 2018-09-28: qty 1

## 2018-09-28 MED ORDER — OXYCODONE-ACETAMINOPHEN 5-325 MG PO TABS
2.0000 | ORAL_TABLET | Freq: Once | ORAL | Status: DC
  Filled 2018-09-28: qty 2

## 2018-09-28 MED ORDER — ONDANSETRON 4 MG PO TBDP
8.0000 mg | ORAL_TABLET | Freq: Once | ORAL | Status: AC
  Administered 2018-09-29: 8 mg via ORAL
  Filled 2018-09-28: qty 2

## 2018-09-28 MED ORDER — OXYCODONE-ACETAMINOPHEN 5-325 MG PO TABS: 1.0000 | ORAL_TABLET | Freq: Once | ORAL | Status: DC

## 2018-09-28 MED ORDER — KETOROLAC TROMETHAMINE 60 MG/2ML IM SOLN
60.0000 mg | Freq: Once | INTRAMUSCULAR | Status: AC
  Administered 2018-09-28: 23:00:00 60 mg via INTRAMUSCULAR
  Filled 2018-09-28: qty 2

## 2018-09-28 NOTE — ED Provider Notes (Signed)
[facility] EMERGENCY DEPARTMENT Provider Note   CSN: [id number] Arrival date & time: 09/28/18  2234    History   Chief Complaint Chief Complaint  Patient presents with  . Wrist Injury    HPI Sheryl Booker is a 28 y.o. female.     28 year old female presents to the emergency department for evaluation of left wrist pain.  She reports that she was rollerskating when she fell approximately 30 minutes prior to arrival.  Pain has been constant, unchanged.  It is slightly alleviated when applying pressure to her left wrist.  She has not taken any medications prior to arrival.  No numbness or inability to move her digits.  She last ate at [employer].  The history is provided by the patient. No language interpreter was used.  Wrist Injury    Past Medical History:  Diagnosis Date  . Allergy     There are no active problems to display for this patient.   History reviewed. No pertinent surgical history.   OB History   No obstetric history on file.      Home Medications    Prior to Admission medications   Medication Sig Start Date End Date Taking? Authorizing Provider  acetaminophen (TYLENOL) 325 MG tablet Take 2 tablets (650 mg total) by mouth every 6 (six) hours. 09/29/18   Mack Hook, MD  ibuprofen (ADVIL) 200 MG tablet Take 2-3 tablets (400-600 mg total) by mouth every 6 (six) hours as needed for headache or moderate pain. 09/29/18   Mack Hook, MD  MONONESSA 0.25-35 MG-MCG tablet TAKE 1 TABLET BY MOUTH EVERY DAY 06/20/16   Wallis Bamberg, PA-C  Multiple Vitamins-Minerals (HAIR/SKIN/NAILS) TABS Take 1 tablet by mouth 2 (two) times daily.    [provider]  norgestimate-ethinyl estradiol (ESTARYLLA) 0.25-35 MG-MCG tablet Take 1 tablet by mouth daily. Office visit needed for refills 08/11/17   Doristine Bosworth, MD  oxyCODONE (ROXICODONE) 5 MG immediate release tablet Take 1 tablet (5 mg total) by mouth every 6 (six) hours as needed (for severe  pain not relieved with tylenol and ibuprofen). 09/29/18   Mack Hook, MD    Family History Family History  Problem Relation Age of Onset  . Diabetes Maternal Grandmother   . Stroke Maternal Grandmother   . Cancer Maternal Grandfather     Social History Social History   Tobacco Use  . Smoking status: Never Smoker  . Smokeless tobacco: Never Used  Substance Use Topics  . Alcohol use: No  . Drug use: No     Allergies   Sulfa antibiotics   Review of Systems Review of Systems Ten systems reviewed and are negative for acute change, except as noted in the HPI.    Physical Exam Updated Vital Signs BP 123/86 (BP Location: Right Arm)   Pulse (!) 114   Temp 99.4 F (37.4 C)   Resp (!) 22   LMP 09/21/2018 (Approximate)   SpO2 94%   Physical Exam Vitals signs and nursing note reviewed.  Constitutional:      General: She is not in acute distress.    Appearance: She is well-developed. She is not diaphoretic.     Comments: Appears uncomfortable, anxious. In NAD.  HENT:     Head: Normocephalic and atraumatic.  Eyes:     General: No scleral icterus.    Conjunctiva/sclera: Conjunctivae normal.  Neck:     Musculoskeletal: Normal range of motion.  Cardiovascular:     Rate and Rhythm: Regular rhythm.  Tachycardia present.     Pulses: Normal pulses.     Comments: Distal radial pulse 1+ in the LUE Pulmonary:     Effort: Pulmonary effort is normal. No respiratory distress.     Comments: Respirations even and unlabored Musculoskeletal:     Comments: Obvious dinner fork deformity to the L wrist. Pain with palpation. Preserved ROM of all digits of the L hand. Capillary refill brisk in all digits of the L hand.  Skin:    General: Skin is warm and dry.     Coloration: Skin is not pale.     Findings: No erythema or rash.  Neurological:     Mental Status: She is alert and oriented to person, place, and time.     Coordination: Coordination normal.     Comments: Sensation to  light touch intact in all digits of the left hand.  Patient able to wiggle all fingers of the left hand.  Psychiatric:        Behavior: Behavior normal.      ED Treatments / Results  Labs (all labs ordered are listed, but only abnormal results are displayed) Labs Reviewed - No data to display  EKG None  Radiology Dg Forearm Left  Result Date: 09/28/2018 CLINICAL DATA:  Recent fall with forearm pain and wrist deformity EXAM: LEFT FOREARM - 2 VIEW COMPARISON:  None. FINDINGS: Comminuted distal radial fracture and ulnar styloid fracture are again noted similar to that seen on the recent wrist films. More proximal radius and ulna are within normal limits. IMPRESSION: Distal radial and ulnar fractures as previously described. Electronically Signed   By: Alcide Clever M.D.   On: 09/28/2018 23:38   Dg Wrist Complete Left  Result Date: 09/29/2018 CLINICAL DATA:  Post reduction left wrist EXAM: LEFT WRIST - COMPLETE 3+ VIEW COMPARISON:  09/28/2018 FINDINGS: Comminuted intra-articular distal left radial fracture again noted. Interval improvement in alignment. Continued mild displacement of fracture fragments, particularly posteriorly on the lateral view. Ulnar styloid fracture again noted. IMPRESSION: Improving alignment across the distal left radial fracture with continued mild displacement of fracture fragments. Electronically Signed   By: Charlett Nose M.D.   On: 09/29/2018 01:43   Dg Wrist Complete Left  Result Date: 09/28/2018 CLINICAL DATA:  Recent fall with wrist deformity, initial encounter EXAM: LEFT WRIST - COMPLETE 3+ VIEW COMPARISON:  None. FINDINGS: Comminuted distal radial fracture is noted with intra-articular extension and impaction and 1 bone width posterior displacement of the distal radial fracture fragments. Ulnar styloid fracture is noted as well. No carpal fractures are seen. IMPRESSION: Distal radial and ulnar fractures with 1 bone width posterior displacement of the distal radial  fractures with respect to the proximal shaft. Articular involvement of the distal radial fracture is seen as well Electronically Signed   By: Alcide Clever M.D.   On: 09/28/2018 23:37    Procedures Procedures (including critical care time)  Medications Ordered in ED Medications  ondansetron (ZOFRAN-ODT) disintegrating tablet 8 mg (0 mg Oral Hold 09/28/18 2346)  ketorolac (TORADOL) injection 60 mg (60 mg Intramuscular Given 09/28/18 2253)  HYDROmorphone (DILAUDID) injection 1 mg (1 mg Intramuscular Given 09/28/18 2253)  lidocaine (PF) (XYLOCAINE) 1 % injection 30 mL (30 mLs Intradermal Given by Other 09/29/18 0050)  HYDROmorphone (DILAUDID) injection 1 mg (1 mg Intramuscular Given 09/29/18 0109)    11:51 PM Hand surgery paged for recommendations.  12:05 AM Dr. Janee Morn to assess patient in the ED. Requests lidocaine, finger traps, Ortho tech to bedside.  1:01 AM Dr. Janee Mornhompson at bedside.  1:45 AM Patient with improved alignment on repeat Xray following hematoma block and bedside reduction.   Initial Impression / Assessment and Plan / ED Course  I have reviewed the triage vital signs and the nursing notes.  Pertinent labs & imaging results that were available during my care of the patient were reviewed by me and considered in my medical decision making (see chart for details).        28 year old female presents for acute left wrist fracture after fall on outstretched hand.  Patient neurovascularly intact on assessment.  Noted to have ulnar styloid fracture as well as distal radius fracture.  Distal radius fracture with significant displacement.  This was reduced at bedside by hand surgery with improved alignment; see Dr. Carollee Massedhompson's note for further detail.  Her pain was controlled in the emergency department with Toradol as well as Dilaudid.  Plan for outpatient follow-up with hand surgery in 2 days.  Return precautions discussed and provided.  Patient discharged in stable condition with no  unaddressed concerns.   Final Clinical Impressions(s) / ED Diagnoses   Final diagnoses:  Closed fracture of distal end of left radius, unspecified fracture morphology, initial encounter  Closed displaced fracture of styloid process of left ulna, initial encounter    ED Discharge Orders         Ordered    ibuprofen (ADVIL) 200 MG tablet  Every 6 hours PRN     09/29/18 0018    acetaminophen (TYLENOL) 325 MG tablet  Every 6 hours     09/29/18 0018    oxyCODONE (ROXICODONE) 5 MG immediate release tablet  Every 6 hours PRN     09/29/18 0054           Antony MaduraHumes, Kaydenn Mclear, PA-C 09/29/18 0147    Glynn Octaveancour, Stephen, MD 09/29/18 [id number]

## 2018-09-28 NOTE — ED Triage Notes (Signed)
Pt reports she fell while skateboarding, obvious deformity noted to left wrist. Last meal at 7:30 pm.

## 2018-09-29 ENCOUNTER — Emergency Department (HOSPITAL_COMMUNITY): Payer: BLUE CROSS/BLUE SHIELD

## 2018-09-29 ENCOUNTER — Other Ambulatory Visit: Payer: Self-pay | Admitting: Orthopedic Surgery

## 2018-09-29 MED ORDER — IBUPROFEN 200 MG PO TABS: 400.0000 mg | ORAL_TABLET | Freq: Four times a day (QID) | ORAL | Status: AC | PRN

## 2018-09-29 MED ORDER — HYDROMORPHONE HCL 1 MG/ML IJ SOLN
1.0000 mg | Freq: Once | INTRAMUSCULAR | Status: AC | PRN
  Administered 2018-09-29: 01:00:00 1 mg via INTRAMUSCULAR

## 2018-09-29 MED ORDER — ACETAMINOPHEN 325 MG PO TABS: 650.0000 mg | ORAL_TABLET | Freq: Four times a day (QID) | ORAL | Status: AC

## 2018-09-29 MED ORDER — LIDOCAINE HCL (PF) 1 % IJ SOLN
30.0000 mL | Freq: Once | INTRAMUSCULAR | Status: AC
  Administered 2018-09-29: 01:00:00 30 mL via INTRADERMAL
  Filled 2018-09-29: qty 30

## 2018-09-29 MED ORDER — OXYCODONE HCL 5 MG PO TABS: 5.0000 mg | ORAL_TABLET | Freq: Four times a day (QID) | ORAL | 0 refills | Status: AC | PRN

## 2018-09-29 MED ORDER — HYDROMORPHONE HCL 1 MG/ML IJ SOLN
INTRAMUSCULAR | Status: AC
  Filled 2018-09-29: qty 1

## 2018-09-29 NOTE — ED Notes (Signed)
All appropriate discharge materials reviewed with patient at length. Time for questions provided. Pt denies any further questions at this time. Verbalizes understanding of all provided materials.  

## 2018-09-29 NOTE — H&P (View-Only) (Signed)
ORTHOPAEDIC CONSULTATION HISTORY & PHYSICAL REQUESTING PHYSICIAN: Glynn Octave, MD  Chief Complaint: left wrist injury  HPI: Sheryl Booker is a 28 y.o. female who reportedly FOOSLH rollerskatiing, with acute injury.  +deformity/pain.  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 Allergy    History reviewed. No pertinent surgical history. Social History   Socioeconomic History  . Marital status: Single    Spouse name: Not on file  . Number of children: Not on file  . Years of education: Not on file  . Highest education level: Not on file  Occupational History  . Not on file  Social Needs  . Financial resource strain: Not on file  . Food insecurity:    Worry: Not on file    Inability: Not on file  . Transportation needs:    Medical: Not on file    Non-medical: Not on file  Tobacco Use  . Smoking status: Never Smoker  . Smokeless tobacco: Never Used  Substance and Sexual Activity  . Alcohol use: No  . Drug use: No  . Sexual activity: Not on file  Lifestyle  . Physical activity:    Days per week: Not on file    Minutes per session: Not on file  . Stress: Not on file  Relationships  . Social connections:    Talks on phone: Not on file    Gets together: Not on file    Attends religious service: Not on file    Active member of club or organization: Not on file    Attends meetings of clubs or organizations: Not on file    Relationship status: Not on file  Other Topics Concern  . Not on file  Social History Narrative   [place] college graduate.   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   Family History  Problem Relation Age of Onset  . Diabetes Maternal Grandmother   . Stroke Maternal Grandmother   . Cancer Maternal Grandfather    Allergies  Allergen Reactions  . Sulfa Antibiotics Anaphylaxis   Prior to Admission medications   Medication Sig Start Date End Date Taking? Authorizing Provider  ibuprofen  (ADVIL,MOTRIN) 200 MG tablet Take 400 mg by mouth every 6 (six) hours as needed for headache or moderate pain.    [provider]  MONONESSA 0.25-35 MG-MCG tablet TAKE 1 TABLET BY MOUTH EVERY DAY 06/20/16   Wallis Bamberg, PA-C  Multiple Vitamins-Minerals (HAIR/SKIN/NAILS) TABS Take 1 tablet by mouth 2 (two) times daily.    [provider]  norgestimate-ethinyl estradiol (ESTARYLLA) 0.25-35 MG-MCG tablet Take 1 tablet by mouth daily. Office visit needed for refills 08/11/17   Doristine Bosworth, MD   Dg Forearm Left  Result Date: 09/28/2018 CLINICAL DATA:  Recent fall with forearm pain and wrist deformity EXAM: LEFT FOREARM - 2 VIEW COMPARISON:  None. FINDINGS: Comminuted distal radial fracture and ulnar styloid fracture are again noted similar to that seen on the recent wrist films. More proximal radius and ulna are within normal limits. IMPRESSION: Distal radial and ulnar fractures as previously described. Electronically Signed   By: Alcide Clever M.D.   On: 09/28/2018 23:38   Dg Wrist Complete Left  Result Date: 09/28/2018 CLINICAL DATA:  Recent fall with wrist deformity, initial encounter EXAM: LEFT WRIST - COMPLETE 3+ VIEW COMPARISON:  None. FINDINGS: Comminuted distal radial fracture is noted with intra-articular extension and impaction and 1 bone width posterior displacement of the distal  radial fracture fragments. Ulnar styloid fracture is noted as well. No carpal fractures are seen. IMPRESSION: Distal radial and ulnar fractures with 1 bone width posterior displacement of the distal radial fractures with respect to the proximal shaft. Articular involvement of the distal radial fracture is seen as well Electronically Signed   By: Mark  Lukens M.D.   On: 09/28/2018 23:37    Positive ROS: All other systems have been reviewed and were otherwise negative with the exception of those mentioned in the HPI and as above.  Physical Exam: Vitals: Refer to EMR. Constitutional:  WD, WN, NAD  HEENT:  NCAT, EOMI Neuro/Psych:  Alert & oriented to person, place, and time; appropriate mood & affect Lymphatic: No generalized extremity edema or lymphadenopathy Extremities / MSK:  The extremities are normal with respect to appearance, ranges of motion, joint stability, muscle strength/tone, sensation, & perfusion except as otherwise noted:  Left wrist deformity, swollen. No laceration. TTP at level of wrist, not elbow.  Intact LT sens R/M/U with intact motor to same.  Radial pulse palpable, pink digits, cap refill brisk  Assessment: 100% dorsally displaced L DRFx, with ulnar styloid base fx   Plan: Injury d/w her, need for provisional reduction discussed and verbal consent obtained.  HT block perfromed by me with plain lidocaine.  Reduction attempted, failed, so hung in fingertrap traction for 5-10 min prior to re-attempt.  Gentle reduction performed, improved alignment confirmed by portable xray, and ST splint applied.  NVI post-reduction.  Will d/c from ED with analgesic plan and with NV precautions reviewed.  Will likely benefit from definitive operative skeletal reduction and stablization in delayed fashion, after initial wave of swelling has come/gone in approx 1 week, but will check new xrays in the office on Thursday.  Aaden Buckman A. Hosteen Kienast, MD      Orthopaedic & Hand Surgery [facility] [address] [place], [place]  [address] Office: [phone] Mobile: [phone]  09/29/2018, 12:05 AM  

## 2018-09-29 NOTE — Consult Note (Signed)
ORTHOPAEDIC CONSULTATION HISTORY & PHYSICAL REQUESTING PHYSICIAN: Glynn Octave, MD  Chief Complaint: left wrist injury  HPI: Sheryl Booker is a 28 y.o. female who reportedly FOOSLH rollerskatiing, with acute injury.  +deformity/pain.  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 Allergy    History reviewed. No pertinent surgical history. Social History   Socioeconomic History  . Marital status: Single    Spouse name: Not on file  . Number of children: Not on file  . Years of education: Not on file  . Highest education level: Not on file  Occupational History  . Not on file  Social Needs  . Financial resource strain: Not on file  . Food insecurity:    Worry: Not on file    Inability: Not on file  . Transportation needs:    Medical: Not on file    Non-medical: Not on file  Tobacco Use  . Smoking status: Never Smoker  . Smokeless tobacco: Never Used  Substance and Sexual Activity  . Alcohol use: No  . Drug use: No  . Sexual activity: Not on file  Lifestyle  . Physical activity:    Days per week: Not on file    Minutes per session: Not on file  . Stress: Not on file  Relationships  . Social connections:    Talks on phone: Not on file    Gets together: Not on file    Attends religious service: Not on file    Active member of club or organization: Not on file    Attends meetings of clubs or organizations: Not on file    Relationship status: Not on file  Other Topics Concern  . Not on file  Social History Narrative   [place] college graduate.   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   Family History  Problem Relation Age of Onset  . Diabetes Maternal Grandmother   . Stroke Maternal Grandmother   . Cancer Maternal Grandfather    Allergies  Allergen Reactions  . Sulfa Antibiotics Anaphylaxis   Prior to Admission medications   Medication Sig Start Date End Date Taking? Authorizing Provider  ibuprofen  (ADVIL,MOTRIN) 200 MG tablet Take 400 mg by mouth every 6 (six) hours as needed for headache or moderate pain.    [provider]  MONONESSA 0.25-35 MG-MCG tablet TAKE 1 TABLET BY MOUTH EVERY DAY 06/20/16   Wallis Bamberg, PA-C  Multiple Vitamins-Minerals (HAIR/SKIN/NAILS) TABS Take 1 tablet by mouth 2 (two) times daily.    [provider]  norgestimate-ethinyl estradiol (ESTARYLLA) 0.25-35 MG-MCG tablet Take 1 tablet by mouth daily. Office visit needed for refills 08/11/17   Doristine Bosworth, MD   Dg Forearm Left  Result Date: 09/28/2018 CLINICAL DATA:  Recent fall with forearm pain and wrist deformity EXAM: LEFT FOREARM - 2 VIEW COMPARISON:  None. FINDINGS: Comminuted distal radial fracture and ulnar styloid fracture are again noted similar to that seen on the recent wrist films. More proximal radius and ulna are within normal limits. IMPRESSION: Distal radial and ulnar fractures as previously described. Electronically Signed   By: Alcide Clever M.D.   On: 09/28/2018 23:38   Dg Wrist Complete Left  Result Date: 09/28/2018 CLINICAL DATA:  Recent fall with wrist deformity, initial encounter EXAM: LEFT WRIST - COMPLETE 3+ VIEW COMPARISON:  None. FINDINGS: Comminuted distal radial fracture is noted with intra-articular extension and impaction and 1 bone width posterior displacement of the distal  radial fracture fragments. Ulnar styloid fracture is noted as well. No carpal fractures are seen. IMPRESSION: Distal radial and ulnar fractures with 1 bone width posterior displacement of the distal radial fractures with respect to the proximal shaft. Articular involvement of the distal radial fracture is seen as well Electronically Signed   By: Alcide CleverMark  Lukens M.D.   On: 09/28/2018 23:37    Positive ROS: All other systems have been reviewed and were otherwise negative with the exception of those mentioned in the HPI and as above.  Physical Exam: Vitals: Refer to EMR. Constitutional:  WD, WN, NAD  HEENT:  NCAT, EOMI Neuro/Psych:  Alert & oriented to person, place, and time; appropriate mood & affect Lymphatic: No generalized extremity edema or lymphadenopathy Extremities / MSK:  The extremities are normal with respect to appearance, ranges of motion, joint stability, muscle strength/tone, sensation, & perfusion except as otherwise noted:  Left wrist deformity, swollen. No laceration. TTP at level of wrist, not elbow.  Intact LT sens R/M/U with intact motor to same.  Radial pulse palpable, pink digits, cap refill brisk  Assessment: 100% dorsally displaced L DRFx, with ulnar styloid base fx   Plan: Injury d/w her, need for provisional reduction discussed and verbal consent obtained.  HT block perfromed by me with plain lidocaine.  Reduction attempted, failed, so hung in fingertrap traction for 5-10 min prior to re-attempt.  Gentle reduction performed, improved alignment confirmed by portable xray, and ST splint applied.  NVI post-reduction.  Will d/c from ED with analgesic plan and with NV precautions reviewed.  Will likely benefit from definitive operative skeletal reduction and stablization in delayed fashion, after initial wave of swelling has come/gone in approx 1 week, but will check new xrays in the office on Thursday.  Cliffton Astersavid A. Janee Mornhompson, MD      [facility]thopaedic & Hand Surgery [facility] [address] [place], [place]  [id number] Office: [phone] Mobile: [phone]  09/29/2018, 12:05 AM

## 2018-09-29 NOTE — ED Notes (Signed)
Hand surgery at bedside.

## 2018-09-29 NOTE — Discharge Instructions (Addendum)
Cast or Splint Care, Adult Casts and splints are supports that are worn to protect broken bones and other injuries. A cast or splint may hold a bone still and in the correct position while it heals. Casts and splints may also help to ease pain, swelling, and muscle spasms. How to care for your cast   Do not stick anything inside the cast to scratch your skin.  Check the skin around the cast every day. Tell your doctor about any concerns.  You may put lotion on dry skin around the edges of the cast. Do not put lotion on the skin under the cast.  Keep the cast clean.  If the cast is not waterproof: ? Do not let it get wet. ? Cover it with a watertight covering when you take a bath or a shower. How to care for your splint   Wear it as told by your doctor. Take it off only as told by your doctor.  Loosen the splint if your fingers or toes tingle, get numb, or turn cold and blue.  Keep the splint clean.  If the splint is not waterproof: ? Do not let it get wet. ? Cover it with a watertight covering when you take a bath or a shower. Follow these instructions at home: Bathing  Do not take baths or swim until your doctor says it is okay. Ask your doctor if you can take showers. You may only be allowed to take sponge baths for bathing.  If your cast or splint is not waterproof, cover it with a watertight covering when you take a bath or shower. Managing pain, stiffness, and swelling  Move your fingers or toes often to avoid stiffness and to lessen swelling.  Raise (elevate) the injured area above the level of your heart while sitting or lying down. Safety  Do not use the injured limb to support your body weight until your doctor says that it is okay.  Use crutches or other assistive devices as told by your doctor. General instructions  Do not put pressure on any part of the cast or splint until it is fully hardened. This may take many hours.  Return to your normal activities as  told by your doctor. Ask your doctor what activities are safe for you.  Keep all follow-up visits as told by your doctor. This is important. Contact a doctor if:  Your cast or splint gets damaged.  The skin around the cast gets red or raw.  The skin under the cast is very itchy or painful.  Your cast or splint feels very uncomfortable.  Your cast or splint is too tight or too loose.  Your cast becomes wet or it starts to have a soft spot or area.  You get an object stuck under your cast. Get help right away if:  Your pain gets worse.  The injured area tingles, gets numb, or turns blue and cold.  The part of your body above or below the cast is swollen and it turns a different color (is discolored).  You cannot feel or move your fingers or toes.  There is fluid leaking through the cast.  You have very bad pain or pressure under the cast.  You have trouble breathing.  You have shortness of breath.  You have chest pain.   Acute Compartment Syndrome  Compartment syndrome is a painful condition that occurs when swelling and pressure build up in a body space (compartment) of the arms or legs. Groups  of muscles, nerves, and blood vessels in the arms and legs are separated into various compartments. Each compartment is surrounded by tough layers of tissue (fascia). In compartment syndrome, pressure builds up within the layers of fascia and begins to push on the structures within that compartment. In acute compartment syndrome, the pressure builds up suddenly, often as the result of an injury. If pressure continues to increase, it can block the flow of blood in the smallest blood vessels (capillaries). Then the muscles in the compartment cannot get enough oxygen and nutrients and will start to die within 4-6 hours. The nerves will begin to die within 12-24 hours. This condition is a medical emergency that must be treated with surgery. What are the causes? This condition may be  caused by:  Injury. Some injuries can cause swelling or bleeding in a compartment. This can lead to compartment syndrome. Injuries that may cause this problem include: ? Broken bones, especially the long bones of the arms and legs. ? Crushing injuries. ? Penetrating injuries, such as a knife wound. ? Badly bruised muscles. ? Poisonous bites, such as a snake bite. ? Severe burns.  Blocked blood flow. This could be a result of: ? A cast or bandage that is too tight. ? A surgical procedure. Blood flow sometimes has to be stopped for a while during a surgery, usually with a tourniquet. ? Lying for too long in a position that restricts blood flow. This can happen in people who have nerve damage or if a person is unconscious for a long time. ? Medicines used to build up muscles (anabolic steroids). ? Medicines that keep the blood from forming clots (blood thinners). What are the signs or symptoms? The most common symptom of this condition is pain. The pain:  May be far more severe than it should be for the injury you have.  May get worse: ? When moving or stretching the affected body part. ? When the area is pushed or squeezed. ? When raising (elevating) affected body part above the level of the heart.  May come with a feeling of tingling or burning.  May not get better when you take pain medicine. Other symptoms include:  A feeling of tightness or fullness in the affected area.  A loss of feeling.  Weakness in the area.  Loss of movement.  Skin becoming pale, tight, and shiny over the painful area.  Warmth and tenderness.  Tensing when the affected area is touched. How is this diagnosed? This condition may be diagnosed based on:  Your physical exam and symptoms.  Measuring the pressure in the affected area (compartment pressure measurement).  Tests to rule out other problems, such as: ? X-rays. ? Blood tests. ? Ultrasound. How is this treated? Treatment for this  condition uses a procedure called fasciotomy. In this procedure, incisions are made through the fascia to relieve the pressure in the compartment and to prevent permanent damage. Before the surgery, first-aid treatment is done, which may include:  Treating any injury.  Loosening or removing any cast, bandage, or external wrap that may be causing pain.  Elevating the painful arm or leg to the same level as the heart.  Giving oxygen.  Giving fluids through an IV tube.  Pain medicine. Summary  Compartment syndrome occurs when swelling and pressure build up in a body space (compartment) of the arms or legs.  First aid treatment may include loosening or removing a cast, bandage, or wrap and elevating the painful arm or  leg at the level of the heart.  In acute compartment syndrome, the pressure builds up suddenly, often as the result of an injury.  This condition is a medical emergency that must be treated with a surgical procedure called fasciotomy. This procedure relieves the pressure and prevents permanent damage. This information is not intended to replace advice given to you by your health care provider. Make sure you discuss any questions you have with your health care provider. Document Released: 05/01/2009 Document Revised: 05/02/2016 Document Reviewed: 05/02/2016 Elsevier Interactive Patient Education  2019 [employer].

## 2018-09-30 ENCOUNTER — Encounter (HOSPITAL_BASED_OUTPATIENT_CLINIC_OR_DEPARTMENT_OTHER): Payer: Self-pay | Admitting: *Deleted

## 2018-09-30 ENCOUNTER — Other Ambulatory Visit: Payer: Self-pay

## 2018-10-01 ENCOUNTER — Other Ambulatory Visit (HOSPITAL_COMMUNITY)
Admission: RE | Admit: 2018-10-01 | Discharge: 2018-10-01 | Disposition: A | Discharge status: Home / Self Care | Payer: BLUE CROSS/BLUE SHIELD | Source: Ambulatory Visit | Attending: Orthopedic Surgery | Admitting: Orthopedic Surgery

## 2018-10-01 DIAGNOSIS — Z1159 Encounter for screening for other viral diseases: Secondary | ICD-10-CM | POA: Insufficient documentation

## 2018-10-01 NOTE — Progress Notes (Signed)
Ensure pre surgery drink given with instructions to complete by 0500 dos, pt verbalized understanding. 

## 2018-10-02 LAB — NOVEL CORONAVIRUS, NAA (HOSP ORDER, SEND-OUT TO REF LAB; TAT 18-24 HRS): SARS-CoV-2, NAA: NOT DETECTED

## 2018-10-05 ENCOUNTER — Encounter (HOSPITAL_BASED_OUTPATIENT_CLINIC_OR_DEPARTMENT_OTHER): Payer: Self-pay | Admitting: *Deleted

## 2018-10-05 ENCOUNTER — Ambulatory Visit (HOSPITAL_BASED_OUTPATIENT_CLINIC_OR_DEPARTMENT_OTHER): Payer: BLUE CROSS/BLUE SHIELD | Admitting: Anesthesiology

## 2018-10-05 ENCOUNTER — Ambulatory Visit (HOSPITAL_COMMUNITY): Payer: BLUE CROSS/BLUE SHIELD

## 2018-10-05 ENCOUNTER — Ambulatory Visit (HOSPITAL_BASED_OUTPATIENT_CLINIC_OR_DEPARTMENT_OTHER)
Admission: RE | Admit: 2018-10-05 | Discharge: 2018-10-05 | Disposition: A | Discharge status: Home / Self Care | Payer: BLUE CROSS/BLUE SHIELD | Attending: Orthopedic Surgery | Admitting: Orthopedic Surgery

## 2018-10-05 ENCOUNTER — Encounter (HOSPITAL_BASED_OUTPATIENT_CLINIC_OR_DEPARTMENT_OTHER)
Admission: RE | Disposition: A | Discharge status: Home / Self Care | Payer: Self-pay | Source: Home / Self Care | Attending: Orthopedic Surgery

## 2018-10-05 DIAGNOSIS — Z833 Family history of diabetes mellitus: Secondary | ICD-10-CM | POA: Diagnosis not present

## 2018-10-05 DIAGNOSIS — Z419 Encounter for procedure for purposes other than remedying health state, unspecified: Secondary | ICD-10-CM

## 2018-10-05 DIAGNOSIS — Y9351 Activity, roller skating (inline) and skateboarding: Secondary | ICD-10-CM | POA: Insufficient documentation

## 2018-10-05 DIAGNOSIS — S52572A Other intraarticular fracture of lower end of left radius, initial encounter for closed fracture: Secondary | ICD-10-CM | POA: Insufficient documentation

## 2018-10-05 DIAGNOSIS — Z882 Allergy status to sulfonamides status: Secondary | ICD-10-CM | POA: Diagnosis not present

## 2018-10-05 DIAGNOSIS — Z809 Family history of malignant neoplasm, unspecified: Secondary | ICD-10-CM | POA: Insufficient documentation

## 2018-10-05 DIAGNOSIS — Z823 Family history of stroke: Secondary | ICD-10-CM | POA: Diagnosis not present

## 2018-10-05 DIAGNOSIS — Z791 Long term (current) use of non-steroidal anti-inflammatories (NSAID): Secondary | ICD-10-CM | POA: Insufficient documentation

## 2018-10-05 HISTORY — PX: OPEN REDUCTION INTERNAL FIXATION (ORIF) DISTAL RADIAL FRACTURE: SHX5989

## 2018-10-05 HISTORY — DX: Anxiety disorder, unspecified: F41.9

## 2018-10-05 LAB — POCT PREGNANCY, URINE: Preg Test, Ur: NEGATIVE

## 2018-10-05 SURGERY — OPEN REDUCTION INTERNAL FIXATION (ORIF) DISTAL RADIUS FRACTURE
Anesthesia: General | Site: Wrist | Laterality: Left

## 2018-10-05 MED ORDER — MIDAZOLAM HCL 2 MG/2ML IJ SOLN
INTRAMUSCULAR | Status: AC
  Filled 2018-10-05: qty 2

## 2018-10-05 MED ORDER — PHENYLEPHRINE 40 MCG/ML (10ML) SYRINGE FOR IV PUSH (FOR BLOOD PRESSURE SUPPORT)
PREFILLED_SYRINGE | INTRAVENOUS | Status: AC
  Filled 2018-10-05: qty 20

## 2018-10-05 MED ORDER — FENTANYL CITRATE (PF) 100 MCG/2ML IJ SOLN: 25.0000 ug | INTRAMUSCULAR | Status: DC | PRN

## 2018-10-05 MED ORDER — ACETAMINOPHEN 325 MG PO TABS: 325.0000 mg | ORAL_TABLET | ORAL | Status: DC | PRN

## 2018-10-05 MED ORDER — PROPOFOL 10 MG/ML IV BOLUS
INTRAVENOUS | Status: AC
  Filled 2018-10-05: qty 20

## 2018-10-05 MED ORDER — LACTATED RINGERS IV SOLN
INTRAVENOUS | Status: DC
  Administered 2018-10-05: 09:00:00 via INTRAVENOUS

## 2018-10-05 MED ORDER — MIDAZOLAM HCL 2 MG/2ML IJ SOLN
1.0000 mg | INTRAMUSCULAR | Status: DC | PRN
  Administered 2018-10-05 (×2): 2 mg via INTRAVENOUS

## 2018-10-05 MED ORDER — CEFAZOLIN SODIUM-DEXTROSE 2-4 GM/100ML-% IV SOLN
INTRAVENOUS | Status: AC
  Filled 2018-10-05: qty 100

## 2018-10-05 MED ORDER — ACETAMINOPHEN 160 MG/5ML PO SOLN: 325.0000 mg | ORAL | Status: DC | PRN

## 2018-10-05 MED ORDER — BUPIVACAINE LIPOSOME 1.3 % IJ SUSP
INTRAMUSCULAR | Status: DC | PRN
  Administered 2018-10-05: 10 mL via PERINEURAL

## 2018-10-05 MED ORDER — LIDOCAINE HCL (CARDIAC) PF 100 MG/5ML IV SOSY
PREFILLED_SYRINGE | INTRAVENOUS | Status: DC | PRN
  Administered 2018-10-05: 30 mg via INTRAVENOUS

## 2018-10-05 MED ORDER — ONDANSETRON HCL 4 MG/2ML IJ SOLN: 4.0000 mg | Freq: Once | INTRAMUSCULAR | Status: DC | PRN

## 2018-10-05 MED ORDER — FENTANYL CITRATE (PF) 100 MCG/2ML IJ SOLN
INTRAMUSCULAR | Status: AC
  Filled 2018-10-05: qty 2

## 2018-10-05 MED ORDER — MEPERIDINE HCL 25 MG/ML IJ SOLN: 6.2500 mg | INTRAMUSCULAR | Status: DC | PRN

## 2018-10-05 MED ORDER — OXYCODONE HCL 5 MG/5ML PO SOLN: 5.0000 mg | Freq: Once | ORAL | Status: DC | PRN

## 2018-10-05 MED ORDER — FENTANYL CITRATE (PF) 100 MCG/2ML IJ SOLN
50.0000 ug | INTRAMUSCULAR | Status: DC | PRN
  Administered 2018-10-05: 08:00:00 100 ug via INTRAVENOUS

## 2018-10-05 MED ORDER — OXYCODONE HCL 5 MG PO TABS: 5.0000 mg | ORAL_TABLET | Freq: Once | ORAL | Status: DC | PRN

## 2018-10-05 MED ORDER — POVIDONE-IODINE 10 % EX SWAB: 2.0000 "application " | Freq: Once | CUTANEOUS | Status: DC

## 2018-10-05 MED ORDER — CEFAZOLIN SODIUM-DEXTROSE 2-4 GM/100ML-% IV SOLN
2.0000 g | INTRAVENOUS | Status: AC
  Administered 2018-10-05: 10:00:00 2 g via INTRAVENOUS

## 2018-10-05 MED ORDER — ONDANSETRON HCL 4 MG/2ML IJ SOLN
INTRAMUSCULAR | Status: DC | PRN
  Administered 2018-10-05: 4 mg via INTRAVENOUS

## 2018-10-05 MED ORDER — 0.9 % SODIUM CHLORIDE (POUR BTL) OPTIME
TOPICAL | Status: DC | PRN
  Administered 2018-10-05: 10:00:00 300 mL

## 2018-10-05 MED ORDER — BUPIVACAINE-EPINEPHRINE (PF) 0.5% -1:200000 IJ SOLN
INTRAMUSCULAR | Status: DC | PRN
  Administered 2018-10-05: 15 mL via PERINEURAL

## 2018-10-05 MED ORDER — PROPOFOL 500 MG/50ML IV EMUL
INTRAVENOUS | Status: DC | PRN
  Administered 2018-10-05: 110 ug/kg/min via INTRAVENOUS

## 2018-10-05 MED ORDER — LACTATED RINGERS IV SOLN
INTRAVENOUS | Status: DC
  Administered 2018-10-05: 11:00:00 via INTRAVENOUS

## 2018-10-05 MED ORDER — EPHEDRINE 5 MG/ML INJ
INTRAVENOUS | Status: AC
  Filled 2018-10-05: qty 20

## 2018-10-05 MED ORDER — SCOPOLAMINE 1 MG/3DAYS TD PT72: 1.0000 | MEDICATED_PATCH | Freq: Once | TRANSDERMAL | Status: DC | PRN

## 2018-10-05 SURGICAL SUPPLY — 80 items
APL PRP STRL LF DISP 70% ISPRP (MISCELLANEOUS) ×1
BIT DRILL 2 FAST STEP (BIT) ×2 IMPLANT
BIT DRILL 2.5X4 QC (BIT) ×1 IMPLANT
BLADE MINI RND TIP GREEN BEAV (BLADE) IMPLANT
BLADE SURG 15 STRL LF DISP TIS (BLADE) ×1 IMPLANT
BLADE SURG 15 STRL SS (BLADE) ×2
BNDG CMPR 9X4 STRL LF SNTH (GAUZE/BANDAGES/DRESSINGS) ×1
BNDG COHESIVE 2X5 TAN STRL LF (GAUZE/BANDAGES/DRESSINGS) IMPLANT
BNDG COHESIVE 4X5 TAN STRL (GAUZE/BANDAGES/DRESSINGS) ×2 IMPLANT
BNDG ESMARK 4X9 LF (GAUZE/BANDAGES/DRESSINGS) ×2 IMPLANT
BNDG GAUZE ELAST 4 BULKY (GAUZE/BANDAGES/DRESSINGS) ×2 IMPLANT
BRUSH SCRUB EZ PLAIN DRY (MISCELLANEOUS) ×2 IMPLANT
CANISTER SUCT 1200ML W/VALVE (MISCELLANEOUS) ×2 IMPLANT
CHLORAPREP W/TINT 26 (MISCELLANEOUS) ×2 IMPLANT
CORD BIPOLAR FORCEPS 12FT (ELECTRODE) ×2 IMPLANT
COVER BACK TABLE REUSABLE LG (DRAPES) ×2 IMPLANT
COVER MAYO STAND REUSABLE (DRAPES) ×2 IMPLANT
COVER WAND RF STERILE (DRAPES) IMPLANT
CUFF TOURN SGL QUICK 18X4 (TOURNIQUET CUFF) ×1 IMPLANT
CUFF TOURN SGL QUICK 24 (TOURNIQUET CUFF)
CUFF TRNQT CYL 24X4X16.5-23 (TOURNIQUET CUFF) IMPLANT
DRAPE C-ARM 42X72 X-RAY (DRAPES) ×2 IMPLANT
DRAPE EXTREMITY T 121X128X90 (DISPOSABLE) ×2 IMPLANT
DRAPE SURG 17X23 STRL (DRAPES) ×2 IMPLANT
DRSG ADAPTIC 3X8 NADH LF (GAUZE/BANDAGES/DRESSINGS) ×2 IMPLANT
DRSG EMULSION OIL 3X3 NADH (GAUZE/BANDAGES/DRESSINGS) ×2 IMPLANT
ELECT REM PT RETURN 9FT ADLT (ELECTROSURGICAL) ×2
ELECTRODE REM PT RTRN 9FT ADLT (ELECTROSURGICAL) ×1 IMPLANT
GAUZE SPONGE 4X4 12PLY STRL LF (GAUZE/BANDAGES/DRESSINGS) ×2 IMPLANT
GLOVE BIO SURGEON STRL SZ7.5 (GLOVE) ×2 IMPLANT
GLOVE BIOGEL PI IND STRL 7.0 (GLOVE) ×2 IMPLANT
GLOVE BIOGEL PI IND STRL 7.5 (GLOVE) ×1 IMPLANT
GLOVE BIOGEL PI IND STRL 8 (GLOVE) ×1 IMPLANT
GLOVE BIOGEL PI INDICATOR 7.0 (GLOVE) ×2
GLOVE BIOGEL PI INDICATOR 7.5 (GLOVE) ×1
GLOVE BIOGEL PI INDICATOR 8 (GLOVE) ×1
GLOVE ECLIPSE 6.5 STRL STRAW (GLOVE) ×2 IMPLANT
GLOVE SURG SYN 7.5  E (GLOVE) ×1
GLOVE SURG SYN 7.5 E (GLOVE) ×1 IMPLANT
GLOVE SURG SYN 7.5 PF PI (GLOVE) IMPLANT
GOWN STRL REUS W/ TWL LRG LVL3 (GOWN DISPOSABLE) ×2 IMPLANT
GOWN STRL REUS W/TWL LRG LVL3 (GOWN DISPOSABLE) ×2
GOWN STRL REUS W/TWL XL LVL3 (GOWN DISPOSABLE) ×4 IMPLANT
K-WIRE 1.6 (WIRE) ×2
K-WIRE FX5X1.6XNS BN SS (WIRE) ×1
KWIRE FX5X1.6XNS BN SS (WIRE) IMPLANT
NEEDLE HYPO 25X1 1.5 SAFETY (NEEDLE) IMPLANT
NS IRRIG 1000ML POUR BTL (IV SOLUTION) ×2 IMPLANT
PACK BASIN DAY SURGERY FS (CUSTOM PROCEDURE TRAY) ×2 IMPLANT
PADDING CAST ABS 4INX4YD NS (CAST SUPPLIES) ×1
PADDING CAST ABS COTTON 4X4 ST (CAST SUPPLIES) IMPLANT
PEG SUBCHONDRAL SMOOTH 2.0X18 (Peg) ×2 IMPLANT
PEG SUBCHONDRAL SMOOTH 2.0X20 (Peg) ×2 IMPLANT
PEG SUBCHONDRAL SMOOTH 2.0X22 (Peg) ×6 IMPLANT
PEG SUBCHONDRAL SMOOTH 2.0X24 (Peg) ×1 IMPLANT
PENCIL BUTTON HOLSTER BLD 10FT (ELECTRODE) ×2 IMPLANT
PLATE SHORT 24.4X51.3 LT (Plate) ×1 IMPLANT
RUBBERBAND STERILE (MISCELLANEOUS) IMPLANT
SCREW BN 12X3.5XNS CORT TI (Screw) IMPLANT
SCREW BN 13X3.5XNS CORT TI (Screw) ×1 IMPLANT
SCREW CORT 3.5X12 (Screw) ×4 IMPLANT
SCREW CORT 3.5X13 (Screw) ×2 IMPLANT
SLEEVE SCD COMPRESS KNEE MED (MISCELLANEOUS) ×2 IMPLANT
SLING ARM FOAM STRAP LRG (SOFTGOODS) ×1 IMPLANT
SPLINT FIBERGLASS 3X35 (CAST SUPPLIES) ×2 IMPLANT
SPLINT PLASTER CAST XFAST 3X15 (CAST SUPPLIES) IMPLANT
SPLINT PLASTER XTRA FASTSET 3X (CAST SUPPLIES)
STOCKINETTE 6  STRL (DRAPES) ×1
STOCKINETTE 6 STRL (DRAPES) ×1 IMPLANT
SUCTION FRAZIER HANDLE 10FR (MISCELLANEOUS) ×1
SUCTION TUBE FRAZIER 10FR DISP (MISCELLANEOUS) ×1 IMPLANT
SUT VIC AB 2-0 PS2 27 (SUTURE) ×2 IMPLANT
SUT VICRYL 4-0 PS2 18IN ABS (SUTURE) IMPLANT
SUT VICRYL RAPIDE 4-0 (SUTURE) IMPLANT
SUT VICRYL RAPIDE 4/0 PS 2 (SUTURE) ×2 IMPLANT
SYR 10ML LL (SYRINGE) IMPLANT
SYR BULB 3OZ (MISCELLANEOUS) ×2 IMPLANT
TOWEL GREEN STERILE FF (TOWEL DISPOSABLE) ×2 IMPLANT
TUBE CONNECTING 20X1/4 (TUBING) ×2 IMPLANT
UNDERPAD 30X30 (UNDERPADS AND DIAPERS) ×2 IMPLANT

## 2018-10-05 NOTE — Transfer of Care (Signed)
Immediate Anesthesia Transfer of Care Note  Patient: Sheryl Booker  Procedure(s) Performed: OPEN REDUCTION INTERNAL FIXATION (ORIF) LEFT DISTAL RADIAL FRACTURE (Left Wrist)  Patient Location: PACU  Anesthesia Type:MAC combined with regional for post-op pain  Level of Consciousness: awake, alert , oriented and patient cooperative  Airway & Oxygen Therapy: Patient Spontanous Breathing and Patient connected to nasal cannula oxygen  Post-op Assessment: Report given to RN and Post -op Vital signs reviewed and stable  Post vital signs: Reviewed and stable  Last Vitals:  Vitals Value Taken Time  BP 105/45 10/05/2018 10:49 AM  Temp    Pulse 97 10/05/2018 10:50 AM  Resp 17 10/05/2018 10:50 AM  SpO2 100 % 10/05/2018 10:50 AM  Vitals shown include unvalidated device data.  Last Pain:  Vitals:   10/05/18 0718  TempSrc: Oral  PainSc: 3          Complications: No apparent anesthesia complications

## 2018-10-05 NOTE — Anesthesia Procedure Notes (Signed)
Procedure Name: MAC Date/Time: 10/05/2018 9:40 AM Performed by: Signe Colt, CRNA Pre-anesthesia Checklist: Patient identified, Emergency Drugs available, Suction available, Patient being monitored and Timeout performed Patient Re-evaluated:Patient Re-evaluated prior to induction Oxygen Delivery Method: Simple face mask

## 2018-10-05 NOTE — Op Note (Signed)
10/05/2018  7:41 AM  PATIENT:  Sheryl Booker  28 y.o. female  PRE-OPERATIVE DIAGNOSIS: Comminuted displaced left intra-articular distal radius fracture  POST-OPERATIVE DIAGNOSIS:  Same  PROCEDURE: ORIF comminuted displaced left intra-articular distal radius fracture, 3+ parts  SURGEON: Rayvon Char. Grandville Silos, MD  PHYSICIAN ASSISTANT: Morley Kos, OPA-C  ANESTHESIA:  Regional/MAC  SPECIMENS:  None  DRAINS: None  EBL:  less than 50 mL  PREOPERATIVE INDICATIONS:  Sheryl Booker is a  28 y.o. female with comminuted displaced left intra-articular distal radius fracture  The risks benefits and alternatives were discussed with the patient preoperatively including but not limited to the risks of infection, bleeding, nerve injury, cardiopulmonary complications, the need for revision surgery, among others, and the patient verbalized understanding and consented to proceed.  OPERATIVE IMPLANTS: Biomet DVR plate/screws/pegs  OPERATIVE PROCEDURE: After receiving prophylactic antibiotics and a regional block, the patient was escorted to the operative theatre and placed in a supine position.  A surgical "time-out" was performed during which the planned procedure, proposed operative site, and the correct patient identity were compared to the operative consent and agreement confirmed by the circulating nurse according to current facility policy. Following application of a tourniquet to the operative extremity, the exposed skin was pre-scrubbed with Hibiclens scrub brush and then was prepped with Chloraprep and draped in the usual sterile fashion. The limb was exsanguinated with an Esmarch bandage and the tourniquet inflated to approximately 162mHg higher than systolic BP.   A sinusoidal-shaped incision was marked and made over the FCR axis and the distal forearm. The skin was incised sharply with scalpel, subcutaneous tissues with blunt and spreading dissection. The FCR axis was exploited deeply.  The pronator quadratus was reflected in an L-shaped ulnarly and the brachioradialis was split in a Z-plasty fashion for later reapproximation. The fracture was inspected and provisionally reduced.  This was confirmed fluoroscopically. The appropriately sized plate was selected and found to fit well. It was placed in its provisional alignment of the radius and this was confirmed fluoroscopically.  It was secured to the radius with a screw through the slotted hole.  Additional adjustments were made as necessary, and the distal holes were all drilled and filled.  Peg/screw length distally was selected on the shorter side of measurements to minimize the risk for dorsal cortical penetration. The remainder of the proximal holes were drilled and filled.   Final images were obtained and the DRUJ was examined for stability. It was found to be sufficiently stable, slightly looser in pronation than supination.  Decision was made to just immobilize the FA in neutral rotation for a few weeks. The wound was then copiously irrigated and the brachioradialis repaired with 2-0 Vicryl Rapide suture followed by repair of the pronator quadratus with the same suture type. Tourniquet was released and additional hemostasis obtained and the skin was closed with 2-0 Vicryl deep dermal buried sutures followed by running 4-0 Vicryl Rapide horizontal mattress suture in the skin. A bulky sugartong spint-dressing was applied with the FA in neutral rotation and the patient was taken to the recovery room in stable condition.  DISPOSITION: The patient will be discharged home today with typical post-op instructions, returning in 10-15 days for reevaluation with new x-rays of the affected wrist out of the splint to include an inclined lateral and then transition to therapy to have a custom [place] splint constructed(FA neutral) and begin rehabilitation.

## 2018-10-05 NOTE — Discharge Instructions (Signed)
°  Post Anesthesia Home Care Instructions  Activity: Get plenty of rest for the remainder of the day. A responsible individual must stay with you for 24 hours following the procedure.  For the next 24 hours, DO NOT: -Drive a car -Advertising copywriter -Drink alcoholic beverages -Take any medication unless instructed by your physician -Make any legal decisions or sign important papers.  Meals: Start with liquid foods such as gelatin or soup. Progress to regular foods as tolerated. Avoid greasy, spicy, heavy foods. If nausea and/or vomiting occur, drink only clear liquids until the nausea and/or vomiting subsides. Call your physician if vomiting continues.  Special Instructions/Symptoms: Your throat may feel dry or sore from the anesthesia or the breathing tube placed in your throat during surgery. If this causes discomfort, gargle with warm salt water. The discomfort should disappear within 24 hours.  If you had a scopolamine patch placed behind your ear for the management of post- operative nausea and/or vomiting:  1. The medication in the patch is effective for 72 hours, after which it should be removed.  Wrap patch in a tissue and discard in the trash. Wash hands thoroughly with soap and water. 2. You may remove the patch earlier than 72 hours if you experience unpleasant side effects which may include dry mouth, dizziness or visual disturbances. 3. Avoid touching the patch. Wash your hands with soap and water after contact with the patch.    Discharge Instructions   You have a dressing with a plaster splint incorporated in it. Move your fingers as much as possible, making a full fist and fully opening the fist. Elevate your hand to reduce pain & swelling of the digits.  Ice over the operative site may be helpful to reduce pain & swelling.  DO NOT USE HEAT. Pain medicine has been prescribed for you.  Take 650 mg of Tylenol and 600 mg of Ibuprofen together every 6 hours over the counter.  Take the 5 mg of Oxycodone as a rescue medicine for severe post operative pain. Leave the dressing in place until you return to our office.  You may shower, but keep the bandage clean & dry.  You may drive a car when you are off of prescription pain medications and can safely control your vehicle with both hands. Our office will call you to arrange follow-up   Please call [phone] during normal business hours or [phone] after hours for any problems. Including the following:  - excessive redness of the incisions - drainage for more than 4 days - fever of more than 101.5 F  *Please note that pain medications will not be refilled after hours or on weekends.  WORK STATUS: Refer to previous work note provided at pre-operative visit.

## 2018-10-05 NOTE — Anesthesia Preprocedure Evaluation (Addendum)
Anesthesia Evaluation  Patient identified by MRN, date of birth, ID band Patient awake    Reviewed: Allergy & Precautions, H&P , NPO status , Patient's Chart, lab work & pertinent test results, reviewed documented beta blocker date and time   Airway Mallampati: I  TM Distance: >3 FB Neck ROM: full    Dental no notable dental hx. (+) Teeth Intact, Dental Advisory Given   Pulmonary neg pulmonary ROS,    Pulmonary exam normal breath sounds clear to auscultation       Cardiovascular Exercise Tolerance: Good negative cardio ROS   Rhythm:regular Rate:Normal     Neuro/Psych negative neurological ROS  negative psych ROS   GI/Hepatic negative GI ROS, Neg liver ROS,   Endo/Other  negative endocrine ROS  Renal/GU negative Renal ROS  negative genitourinary   Musculoskeletal   Abdominal   Peds  Hematology negative hematology ROS (+)   Anesthesia Other Findings   Reproductive/Obstetrics negative OB ROS                            Anesthesia Physical Anesthesia Plan  ASA: II  Anesthesia Plan: General   Post-op Pain Management: GA combined w/ Regional for post-op pain   Induction:   PONV Risk Score and Plan: 3 and Dexamethasone, Ondansetron, Treatment may vary due to age or medical condition and Midazolam  Airway Management Planned: Oral ETT and LMA  Additional Equipment:   Intra-op Plan:   Post-operative Plan: Extubation in OR  Informed Consent: I have reviewed the patients History and Physical, chart, labs and discussed the procedure including the risks, benefits and alternatives for the proposed anesthesia with the patient or authorized representative who has indicated his/her understanding and acceptance.     Dental Advisory Given  Plan Discussed with: CRNA, Anesthesiologist and Surgeon  Anesthesia Plan Comments: (Discussed both nerve block for pain relief post-op and GA; including  NV, sore throat, dental injury, and pulmonary complications)        Anesthesia Quick Evaluation

## 2018-10-05 NOTE — Anesthesia Postprocedure Evaluation (Signed)
Anesthesia Post Note  Patient: Sheryl Booker  Procedure(s) Performed: OPEN REDUCTION INTERNAL FIXATION (ORIF) LEFT DISTAL RADIAL FRACTURE (Left Wrist)     Anesthesia Post Evaluation  Last Vitals:  Vitals:   10/05/18 0845 10/05/18 1049  BP: 139/74 (!) 105/45  Pulse: 99 99  Resp: 18 (P) 19  Temp:  (!) (P) 36.3 C  SpO2: 98% 99%    Last Pain:  Vitals:   10/05/18 0718  TempSrc: Oral  PainSc: 3                  Enes Rokosz

## 2018-10-05 NOTE — Interval H&P Note (Signed)
History and Physical Interval Note:  10/05/2018 7:41 AM  Sheryl Booker  has presented today for surgery, with the diagnosis of LEFT DISTAL RADIUS FRACUTRE.  The various methods of treatment have been discussed with the patient and family. After consideration of risks, benefits and other options for treatment, the patient has consented to  Procedure(s): OPEN REDUCTION INTERNAL FIXATION (ORIF) LEFT DISTAL RADIAL FRACTURE POSSIBLE OPEN TRIANGLUAR FIBROCARTILAGE COMPLEX  REPAIR (Left) as a surgical intervention.  The patient's history has been reviewed, patient examined, no change in status, stable for surgery.  I have reviewed the patient's chart and labs.  Questions were answered to the patient's satisfaction.     Jodi Marble

## 2018-10-05 NOTE — Anesthesia Procedure Notes (Signed)
Anesthesia Regional Block: Supraclavicular block   Pre-Anesthetic Checklist: ,, timeout performed, Correct Patient, Correct Site, Correct Laterality, Correct Procedure, Correct Position, site marked, Risks and benefits discussed,  Surgical consent,  Pre-op evaluation,  At surgeon's request and post-op pain management  Laterality: Left  Prep: chloraprep       Needles:  Injection technique: Single-shot  Needle Type: Echogenic Stimulator Needle     Needle Length: 5cm  Needle Gauge: 22     Additional Needles:   Procedures:, nerve stimulator,,, ultrasound used (permanent image in chart),,,,   Nerve Stimulator or Paresthesia:  Response: hand, 0.45 mA,   Additional Responses:   Narrative:  Start time: 10/05/2018 8:29 AM End time: 10/05/2018 8:34 AM Injection made incrementally with aspirations every 5 mL.  Performed by: Personally  Anesthesiologist: Bethena Midget, MD  Additional Notes: Functioning IV was confirmed and monitors were applied.  A 59mm 22ga Arrow echogenic stimulator needle was used. Sterile prep and drape,hand hygiene and sterile gloves were used. Ultrasound guidance: relevant anatomy identified, needle position confirmed, local anesthetic spread visualized around nerve(s)., vascular puncture avoided.  Image printed for medical record. Negative aspiration and negative test dose prior to incremental administration of local anesthetic. The patient tolerated the procedure well.

## 2018-10-05 NOTE — Progress Notes (Signed)
Assisted Dr. Oddono with left, ultrasound guided, supraclavicular block. Side rails up, monitors on throughout procedure. See vital signs in flow sheet. Tolerated Procedure well. 

## 2018-10-06 ENCOUNTER — Encounter (HOSPITAL_BASED_OUTPATIENT_CLINIC_OR_DEPARTMENT_OTHER): Payer: Self-pay | Admitting: Orthopedic Surgery

## 2019-09-24 IMAGING — RF LEFT WRIST - COMPLETE 3+ VIEW
1 series · 3 of 3 positions shown · non-contrast
Comparison: 09/29/2018

CLINICAL DATA: Surgery for left radius fracture.

EXAM:
DG C-ARM 61-120 MIN; LEFT WRIST - COMPLETE 3+ VIEW

[Series 1: run · 3 of 3 slices shown]
[im 1/3]
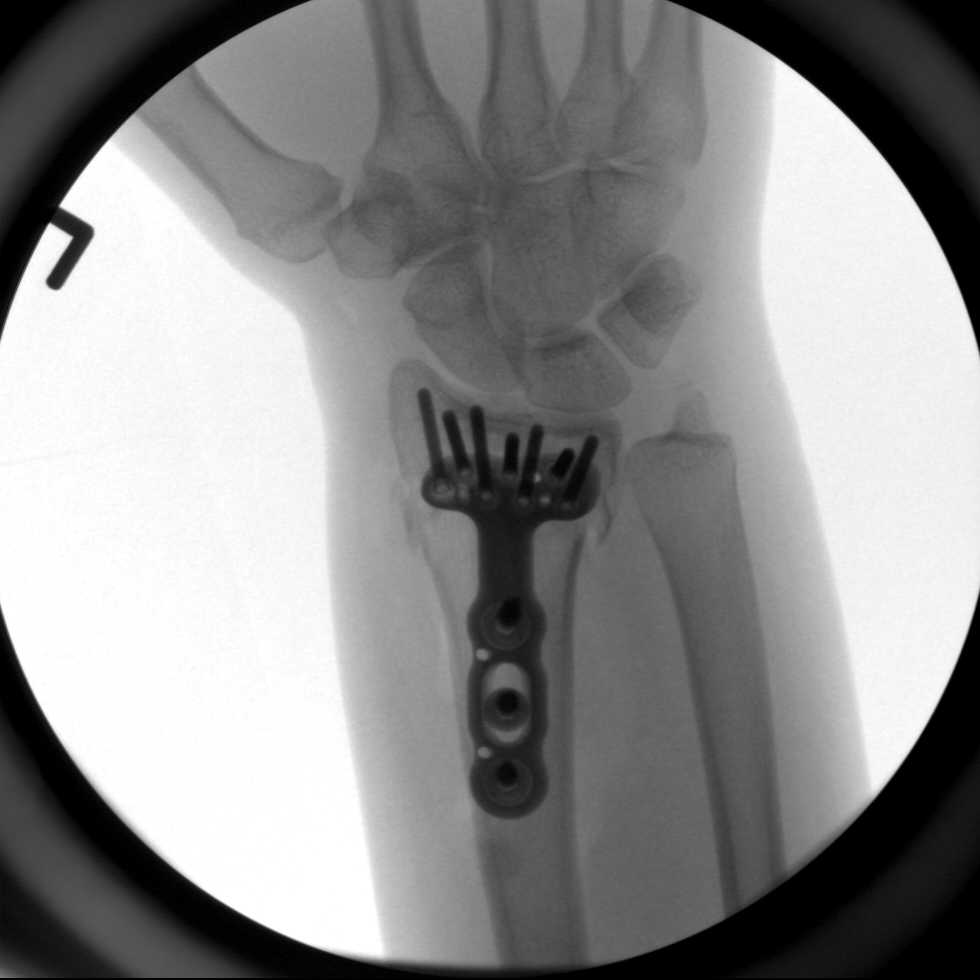
[im 2/3]
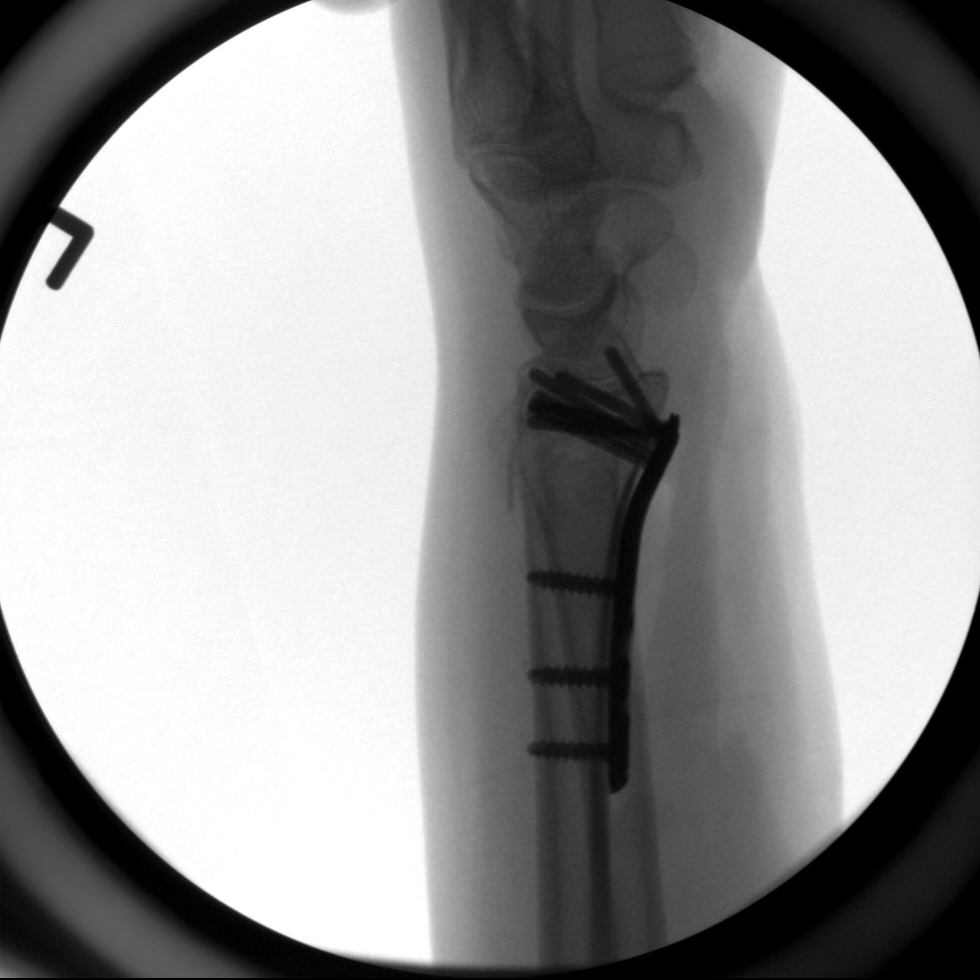
[im 3/3]
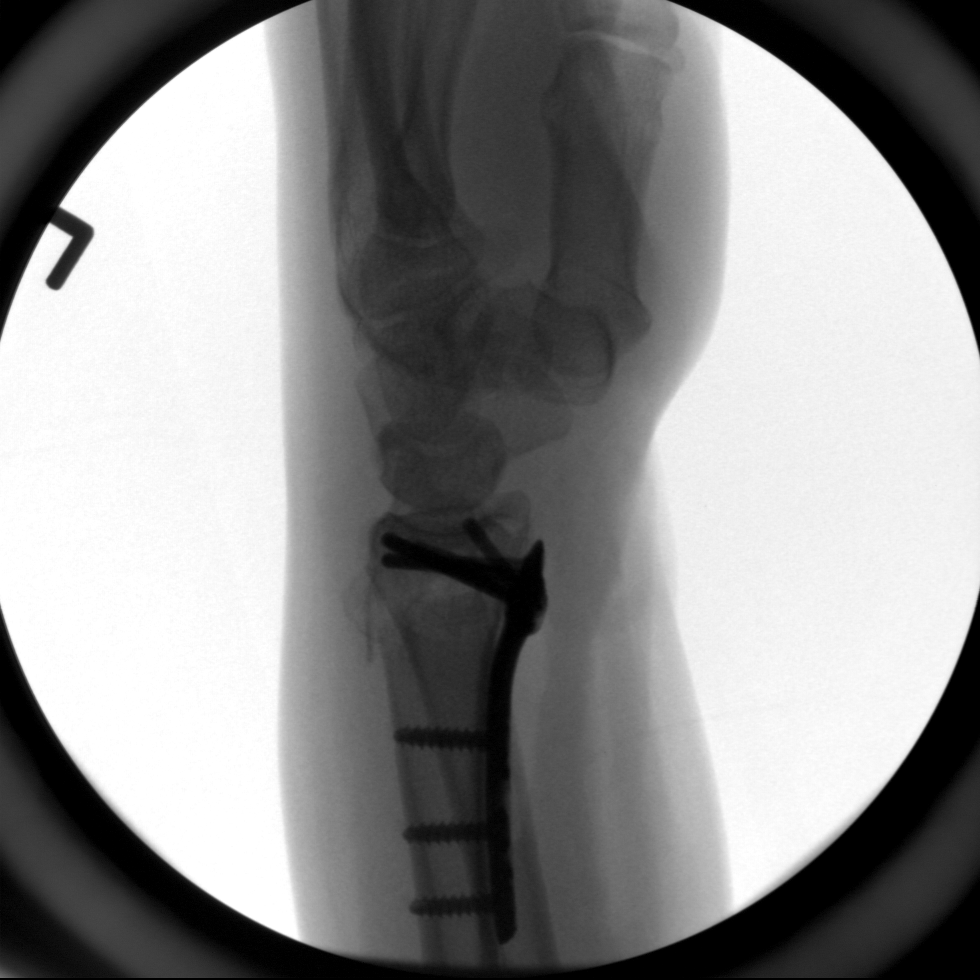

[3 of 3 positions shown; findings below may reference images not displayed]

FINDINGS: Fluoroscopic images demonstrate surgical plate and screw fixation of
the distal radial fracture. Surgical plate is along the volar aspect
of the distal radius. Wrist is located. Again noted is the ulnar
styloid fracture.
IMPRESSION: Internal fixation of the distal radial fracture with plate and
screws.
